# Patient Record
Sex: Female | Born: 2000 | Hispanic: No | Marital: Single | Smoking: Never smoker
Health system: Southern US, Community
[De-identification: ages and names within clinical notes are randomized; demographics above are authoritative.]

## PROBLEM LIST (undated history)

## (undated) DIAGNOSIS — H539 Unspecified visual disturbance: Secondary | ICD-10-CM

## (undated) DIAGNOSIS — L709 Acne, unspecified: Secondary | ICD-10-CM

## (undated) HISTORY — DX: Acne, unspecified: L70.9

## (undated) HISTORY — DX: Unspecified visual disturbance: H53.9

---
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
## 2019-02-02 ENCOUNTER — Ambulatory Visit (INDEPENDENT_AMBULATORY_CARE_PROVIDER_SITE_OTHER): Payer: No Typology Code available for payment source | Admitting: Licensed Clinical Social Worker

## 2019-02-02 ENCOUNTER — Other Ambulatory Visit: Payer: Self-pay

## 2019-02-02 ENCOUNTER — Ambulatory Visit (INDEPENDENT_AMBULATORY_CARE_PROVIDER_SITE_OTHER): Payer: No Typology Code available for payment source

## 2019-02-02 VITALS — BP 104/64 | HR 64 | Ht 63.0 in | Wt 140.4 lb

## 2019-02-02 DIAGNOSIS — F411 Generalized anxiety disorder: Secondary | ICD-10-CM

## 2019-02-02 DIAGNOSIS — Z3042 Encounter for surveillance of injectable contraceptive: Secondary | ICD-10-CM | POA: Diagnosis not present

## 2019-02-02 DIAGNOSIS — Z3202 Encounter for pregnancy test, result negative: Secondary | ICD-10-CM | POA: Diagnosis not present

## 2019-02-02 DIAGNOSIS — Z1389 Encounter for screening for other disorder: Secondary | ICD-10-CM

## 2019-02-02 LAB — POCT URINALYSIS DIPSTICK
Bilirubin, UA: NEGATIVE
Blood, UA: NEGATIVE
Glucose, UA: NEGATIVE
Ketones, UA: NEGATIVE
Leukocytes, UA: NEGATIVE
Nitrite, UA: NEGATIVE
Protein, UA: NEGATIVE
Spec Grav, UA: 1.01 (ref 1.010–1.025)
Urobilinogen, UA: NEGATIVE U/dL — AB
pH, UA: 7 (ref 5.0–8.0)

## 2019-02-02 LAB — POCT URINE PREGNANCY: Preg Test, Ur: NEGATIVE

## 2019-02-02 MED ORDER — MEDROXYPROGESTERONE ACETATE 150 MG/ML IM SUSP
150.0000 mg | Freq: Once | INTRAMUSCULAR | Status: AC
  Administered 2019-02-02: 150 mg via INTRAMUSCULAR

## 2019-02-02 NOTE — Patient Instructions (Signed)
Contraceptive Injection  A contraceptive injection is a shot that prevents pregnancy. It is also called the birth control shot. The shot contains the hormone progestin, which prevents pregnancy by:  · Stopping the ovaries from releasing eggs.  · Thickening cervical mucus to prevent sperm from entering the cervix.  · Thinning the lining of the uterus to prevent a fertilized egg from attaching to the uterus.  Contraceptive injections are given under the skin (subcutaneous) or into a muscle (intramuscular). For these shots to work, you must get one of them every 3 months (12 weeks) from a health care provider.  Tell a health care provider about:  · Any allergies you have.  · All medicines you are taking, including vitamins, herbs, eye drops, creams, and over-the-counter medicines.  · Any blood disorders you have.  · Any medical conditions you have.  · Whether you are pregnant or may be pregnant.  What are the risks?  Generally, this is a safe procedure. However, problems may occur, including:  · Mood changes or depression.  · Loss of bone density (osteoporosis) after long-term use.  · Blood clots.  · Higher risk of an egg being fertilized outside your uterus (ectopic pregnancy).This is rare.  What happens before the procedure?  · Your health care provider may do a routine physical exam.  · You may have a test to make sure you are not pregnant.  What happens during the procedure?  · The area where the shot will be given will be cleaned and sanitized with alcohol.  · A needle will be inserted into a muscle in your upper arm or buttock, or into the skin of your thigh or abdomen. The needle will be attached to a syringe with the medicine inside of it.  · The medicine will be pushed through the syringe and injected into your body.  · A small bandage (dressing) may be placed over the injection site.  What can I expect after the procedure?  · After the procedure, it is common to have:  ? Soreness around the injection site  for a couple of days.  ? Irregular menstrual bleeding.  ? Weight gain.  ? Breast tenderness.  ? Headaches.  ? Discomfort in your abdomen.  · Ask your health care provider whether you need to use an added method of birth control (backup contraception), such as a condom, sponge, or spermicide.  ? If the first shot is given 1-7 days after the start of your last period, you will not need backup contraception.  ? If the first shot is given at any other time during your menstrual cycle, you should avoid having sex or you will need backup contraception for 7 days after you receive the shot.  Follow these instructions at home:  General instructions    · Take over-the-counter and prescription medicines only as told by your health care provider.  · Do not massage the injection site.  · Track your menstrual periods so you will know if they become irregular.  · Always use a condom to protect against STIs (sexually transmitted infections).  · Make sure you schedule an appointment in time for your next shot, and mark it on your calendar. For the birth control to prevent pregnancy, you must get the injections every 3 months (12 weeks).  Lifestyle  · Do not use any products that contain nicotine or tobacco, such as cigarettes and e-cigarettes. If you need help quitting, ask your health care provider.  · Eat foods   that are high in calcium and vitamin D, such as milk, cheese, and salmon. Doing this may help with any loss in bone density that is caused by the contraceptive injection. Ask your health care provider for dietary recommendations.  Contact a health care provider if:  · You have nausea or vomiting.  · You have abnormal vaginal discharge or bleeding.  · You miss a period or you think you might be pregnant.  · You experience mood changes or depression.  · You feel dizzy or light-headed.  · You have leg pain.  Get help right away if:  · You have chest pain.  · You cough up blood.  · You have shortness of breath.  · You have a  severe headache that does not go away.  · You have numbness in any part of your body.  · You have slurred speech.  · You have vision problems.  · You have vaginal bleeding that is abnormally heavy or does not stop.  · You have severe pain in your abdomen.  · You have depression that does not get better with treatment.  If you ever feel like you may hurt yourself or others, or have thoughts about taking your own life, get help right away. You can go to your nearest emergency department or call:  · Your local emergency services (911 in the [place]  · A suicide crisis helpline, such as the National Suicide Prevention Lifeline at 1-800-273-8255. This is open 24 hours a day.  Summary  · A contraceptive injection is a shot that prevents pregnancy. It is also called the birth control shot.  · The shot is given under the skin (subcutaneous) or into a muscle (intramuscular).  · After this procedure, it is common to have soreness around the injection site for a couple of days.  · To prevent pregnancy, the shot must be given by a health care provider every 3 months (12 weeks).  · After you have the shot, ask your health care provider whether you need to use an added method of birth control (backup contraception), such as a condom, sponge, or spermicide.  This information is not intended to replace advice given to you by your health care provider. Make sure you discuss any questions you have with your health care provider.  Document Released: 07/04/2017 Document Revised: 07/04/2017 Document Reviewed: 07/04/2017  Elsevier Interactive Patient Education © 2019 [employer]vier Inc[employer]

## 2019-02-02 NOTE — BH Specialist Note (Signed)
Integrated Behavioral Health Initial Visit  MRN: [id number] Name: Jean Thompson  Number of Integrated Behavioral Health Clinician visits:: 1/6 Session Start time: 11:30  Session End time: 11:50 Total time: 20 minutes  Type of Service: Integrated Behavioral Health- Individual/Family Interpretor:No. Interpretor Name and Language: n/a   Warm Hand Off Completed.       SUBJECTIVE: Jean Thompson is a 18 y.o. female accompanied by self Patient was referred by Adolescent medicine for Med check in. Patient reports the following symptoms/concerns: Pt reports ongoing symptoms of anxiety, feels like they are well under control with med mgmt and coping skills. Pt reports anxiety around classes being cancelled and shift to online classes. Pt reports knowledge of relaxation techniques and supportive environment. Pt is also interested in birth control, specifically the Nexplanon implant. Pt reports using condoms currently. Duration of problem: ongoing anxiety concerns; Severity of problem: mild  OBJECTIVE: Mood: Anxious and Euthymic and Affect: Appropriate Risk of harm to self or others: No plan to harm self or others  LIFE CONTEXT: Family and Social: Lives on campus for now, will return to parents house when campus closes. Pt reports having supportive relationships in her life. 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  GOALS ADDRESSED: Patient will: 1. Demonstrate ability to: Maintain medication compliance  INTERVENTIONS: Interventions utilized: Supportive Counseling and Psychoeducation and/or Health Education  Standardized Assessments completed: Med side effect  check list and PHQ-SADS   PHQ-SADS SCORES 02/02/2019  PHQ-15 Score 1  Total GAD-7 Score 8  a. In the last 4 weeks, have you had an anxiety attack-suddenly feeling fear or panic? No  PHQ Adolescent Score 3  If you checked off any problems on this questionnaire, how difficult have these problems made it for you to do your work, take care of things at home, or get along with other people? Not difficult at all      Symptom Score (0-3) Linked to Medication? Comments  Dry Mouth 0    Drowsiness 0    Insomnia 0    Blurred Vision 0    Headache 3 Yes After forgetting meds and returning to taking meds  Constipation 0    Diarrhea  0    Increased Appetite 0    Decreased Appetite 0    Nausea/Vomiting 0    Problems Urinating 0    Problems with Sex 0    Palpitations 0    Lightheaded on Standing 0    Room Spinning 0    Sweating 0    Feeling Hot 0    Tremor 0    Disoriented 0    Yawning 0    Weight Gain 0    Other Symptoms? None  Treatment for Side Effects? Will lay down to rest when head hurts, may take ibuprofen  Side Effects make you want to stop taking?? Sometimes headaches after missed doses     ASSESSMENT: Patient currently experiencing ongoing and managed symptoms of anxiety. Pt also experiencing interest in different methods of birth control.   Patient may benefit from continuing to take anti-anxiety meds as prescribed, as well as continuing to implement effective coping strategies. Pt may also benefit from returning to see med provider for nexplanon implant.  PLAN: 1. Follow up with behavioral health clinician on : PRN; follow up w/ medical provider 03/16/2019 2. Behavioral recommendations: Pt will take meds as prescribed and reach out for support if needed 3. Referral(s): None at this time 4. "From scale of 1-10, how likely are you to follow plan?": Pt voiced understanding and agreement  Noralyn Pick, LPCA

## 2019-02-02 NOTE — Progress Notes (Signed)
negPt here today for vitals check. Collaborated with NP- plan of care made. Follow up scheduled for 4/24. She does express interest in Nexplanon insertion today. No provider here to perform insertion. Pt interested in getting depo for contraception to bridge her until f/u appointment with [facility] for insertion. Currently consistently using condoms and urine pregnancy negative. Depo given and patient education provided.

## 2019-02-11 ENCOUNTER — Other Ambulatory Visit: Payer: Self-pay | Admitting: Pediatrics

## 2019-02-11 DIAGNOSIS — Z889 Allergy status to unspecified drugs, medicaments and biological substances status: Secondary | ICD-10-CM

## 2019-02-21 DIAGNOSIS — H52223 Regular astigmatism, bilateral: Secondary | ICD-10-CM | POA: Diagnosis not present

## 2019-02-21 DIAGNOSIS — H5213 Myopia, bilateral: Secondary | ICD-10-CM | POA: Diagnosis not present

## 2019-02-21 DIAGNOSIS — H538 Other visual disturbances: Secondary | ICD-10-CM | POA: Diagnosis not present

## 2019-02-28 ENCOUNTER — Telehealth: Payer: Self-pay | Admitting: Licensed Clinical Social Worker

## 2019-02-28 NOTE — Telephone Encounter (Signed)
[facility] called pt to check in and offer support. Pt requested to schedule virtual visit. [facility] and pt to have virtual session 03/01/2019

## 2019-03-01 ENCOUNTER — Ambulatory Visit: Payer: No Typology Code available for payment source | Admitting: Licensed Clinical Social Worker

## 2019-03-01 ENCOUNTER — Telehealth: Payer: Self-pay | Admitting: Licensed Clinical Social Worker

## 2019-03-01 NOTE — Telephone Encounter (Signed)
[facility] called pt's mother at time of appt to follow up w/ pt. Mom reports not being at home w/ pt at the moment, will let pt know [facility] reached out. [facility] to call pt 03/02/19 to reschedule if pt interested.

## 2019-03-08 ENCOUNTER — Ambulatory Visit (INDEPENDENT_AMBULATORY_CARE_PROVIDER_SITE_OTHER): Payer: No Typology Code available for payment source | Admitting: Licensed Clinical Social Worker

## 2019-03-08 ENCOUNTER — Other Ambulatory Visit: Payer: Self-pay

## 2019-03-08 DIAGNOSIS — F411 Generalized anxiety disorder: Secondary | ICD-10-CM | POA: Diagnosis not present

## 2019-03-08 NOTE — BH Specialist Note (Signed)
Integrated Behavioral Health via Telemedicine Video Visit  03/08/2019 Jean Thompson [id number]  Session Start time: 1:45  Session End time: 2:18 Total time: 33 mins  Referring Provider: Adolescent medicine Type of Visit: Video Patient/Family location: Home [facility] Provider location: [facility] clinic All persons participating in visit: Pt and [facility]  Confirmed patient's address: Yes  Confirmed patient's phone number: Yes  Any changes to demographics: No   Confirmed patient's insurance: Yes  Any changes to patient's insurance: No   Discussed confidentiality: Yes   I connected with Jean Thompson by a video enabled telemedicine application and verified that I am speaking with the correct person using two identifiers.     I discussed the limitations of evaluation and management by telemedicine and the availability of in person appointments.  I discussed that the purpose of this visit is to provide behavioral health care while limiting exposure to the novel coronavirus.   Discussed there is a possibility of technology failure and discussed alternative modes of communication if that failure occurs.  I discussed that engaging in this video visit, they consent to the provision of behavioral healthcare and the services will be billed under their insurance.  Patient and/or legal guardian expressed understanding and consented to video visit: Pt consented  PRESENTING CONCERNS: Patient and/or family reports the following symptoms/concerns: Pt reports trying to stay calm during quarantine period. Pt reports often feeling antsy because she is not able to stick to her typical routine. Pt reports several coping skills that help her to stay busy, including painting, doing her school work, spending time with her sister, and exercising. Pt reports feeling worried about eating when bored, tries not to let herself get bored. Pt identifies when she is hungry and eats at those times. Pt also expresses  interest in making her space feel more comfortable and soothing by painting the walls in her room. Duration of problem: ongoing anxiety; Severity of problem: moderate  STRENGTHS (Protective Factors/Coping Skills): Pt is able to express herself artistically Pt has supportive relationships within and outside of her family Pt is able to identify regular schedule that feels comfortable for pt  GOALS ADDRESSED: Patient will: 1.  Increase knowledge and/or ability of: coping skills  2.  Demonstrate ability to: Increase healthy adjustment to current life circumstances  INTERVENTIONS: Interventions utilized:  Solution-Focused Strategies, Behavioral Activation, Supportive Counseling and Psychoeducation and/or Health Education Standardized Assessments completed: Not Needed  ASSESSMENT: Patient currently experiencing ongoing symptoms of anxiety and restlessness, as evidenced by pt's report. Pt is able to implement effective coping skills to help reduce anxieties. Pt is having some difficulty navigating school work virtually, is able to reach out to professors.   Patient may benefit from continuing to implement effective coping strategies, including artistic and physical expression.  PLAN: 1. Follow up with behavioral health clinician on : 03/23/2019 2. Behavioral recommendations: PT will call professor for quicker response time. Pt will continue to spend time outside when feeling antsy. Pt will focus on self-talk 3. Referral(s): Integrated [facility] (In Clinic)  I discussed the assessment and treatment plan with the patient and/or parent/guardian. They were provided an opportunity to ask questions and all were answered. They agreed with the plan and demonstrated an understanding of the instructions.   They were advised to call back or seek an in-person evaluation if the symptoms worsen or if the condition fails to improve as anticipated.  Jean Thompson

## 2019-03-16 ENCOUNTER — Ambulatory Visit (INDEPENDENT_AMBULATORY_CARE_PROVIDER_SITE_OTHER): Payer: No Typology Code available for payment source | Admitting: Family

## 2019-03-16 ENCOUNTER — Other Ambulatory Visit: Payer: Self-pay

## 2019-03-16 DIAGNOSIS — Z3042 Encounter for surveillance of injectable contraceptive: Secondary | ICD-10-CM | POA: Diagnosis not present

## 2019-03-16 NOTE — Progress Notes (Signed)
Virtual Visit via Telephone Note  I connected with Jean Thompson   on 03/16/19 at 10:00 AM EDT by telephone and verified that I am speaking with the correct person using two identifiers. Location of patient/parent: home   I discussed the limitations, risks, security and privacy concerns of performing an evaluation and management service by telephone and the availability of in person appointments. I discussed that the purpose of this phone visit is to provide medical care while limiting exposure to the novel coronavirus.  I also discussed with the patient that there may be a patient responsible charge related to this service. The patient expressed understanding and agreed to proceed.  Reason for visit: wants to come in to talk about birth control options  History of Present Illness:  -interested in nexplanon  -she had depo injection on 3/13 and wants to switch methods -condom use now    Assessment and Plan:  -scheduled for in office follow up for nexplanon placement, still well within her depo window  Follow Up Instructions:    I discussed the assessment and treatment plan with the patient and/or parent/guardian. They were provided an opportunity to ask questions and all were answered. They agreed with the plan and demonstrated an understanding of the instructions.   They were advised to call back or seek an in-person evaluation in the emergency room if the symptoms worsen or if the condition fails to improve as anticipated.  I provided 5 minutes of non-face-to-face time during this encounter. I was located off-site during this encounter.  Georges Mouse, NP

## 2019-03-21 ENCOUNTER — Other Ambulatory Visit: Payer: Self-pay

## 2019-03-21 ENCOUNTER — Encounter: Payer: Self-pay | Admitting: Family

## 2019-03-21 ENCOUNTER — Ambulatory Visit (INDEPENDENT_AMBULATORY_CARE_PROVIDER_SITE_OTHER): Payer: No Typology Code available for payment source | Admitting: Family

## 2019-03-21 ENCOUNTER — Encounter: Payer: No Typology Code available for payment source | Admitting: Licensed Clinical Social Worker

## 2019-03-21 VITALS — BP 103/70 | HR 71 | Ht 62.48 in | Wt 135.6 lb

## 2019-03-21 DIAGNOSIS — R748 Abnormal levels of other serum enzymes: Secondary | ICD-10-CM | POA: Diagnosis not present

## 2019-03-21 DIAGNOSIS — Z3202 Encounter for pregnancy test, result negative: Secondary | ICD-10-CM

## 2019-03-21 DIAGNOSIS — F4322 Adjustment disorder with anxiety: Secondary | ICD-10-CM

## 2019-03-21 DIAGNOSIS — Z3009 Encounter for other general counseling and advice on contraception: Secondary | ICD-10-CM | POA: Diagnosis not present

## 2019-03-21 DIAGNOSIS — F509 Eating disorder, unspecified: Secondary | ICD-10-CM

## 2019-03-21 LAB — POCT URINE PREGNANCY: Preg Test, Ur: NEGATIVE

## 2019-03-21 NOTE — Patient Instructions (Signed)
Check these websites out for more information!     [email or web address]   [email or web address]  Also, set up a My Chart account so we can discuss! Let me know when you decide what you want to do!

## 2019-03-22 ENCOUNTER — Encounter: Payer: Self-pay | Admitting: Family

## 2019-03-22 NOTE — Progress Notes (Addendum)
History was provided by the patient.  Jean Thompson is a 18 y.o. female who is here for contraceptive counseling.   PCP confirmed? Yes.    Kalman Jewels, MD  HPI:  -using condoms, wants information only today -had depo on 3/13 when she was considering nexplanon at that time  -then wants to talk with mom and BF more -she does not care if she bleeds or if her period stops w the method -she has no pregnancy intent and likes the idea of a long-term method -she admits she forgets to take medicines -not sure if she would want depo bc of her eating disorder hx, does not want the method she chooses to cause weight gain   Review of Systems  Constitutional: Negative for fever and weight loss.  HENT: Negative for sore throat.   Respiratory: Negative for cough and shortness of breath.   Cardiovascular: Negative for chest pain.  Gastrointestinal: Negative for abdominal pain, nausea and vomiting.  Genitourinary: Negative for dysuria and frequency.  Musculoskeletal: Negative for myalgias.  Skin: Negative for rash.  Neurological: Negative for dizziness and headaches (no migraines w aura).     Patient Active Problem List   Diagnosis Date Noted  . Acute midline thoracic back pain 05/26/2018  . Adjustment disorder with anxious mood 08/17/2017  . Elevated liver enzymes 05/10/2017  . Disordered eating 04/06/2017  . Bunion of left foot 03/04/2016  . Other seasonal allergic rhinitis 02/25/2016  . Acne     Current Outpatient Medications on File Prior to Visit  Medication Sig Dispense Refill  . cetirizine (ZYRTEC) 10 MG tablet Take 1 tablet by mouth once daily 30 tablet 0  . Ergocalciferol (VITAMIN D2) 400 units TABS Take 200 mg by mouth.    Marland Kitchen FLUoxetine (PROZAC) 20 MG capsule Take 1 capsule (20 mg total) by mouth daily. 30 capsule 3  . hydrocortisone 1 % ointment Apply 1 application topically 2 (two) times daily. Apply to itchy area after completing antibiotic treatment as needed 30  g 0  . diclofenac sodium (VOLTAREN) 1 % GEL Apply 2 g topically 4 (four) times daily. (Patient not taking: Reported on 03/21/2019) 100 g 3  . methylPREDNISolone (MEDROL) 4 MG tablet Medrol dose pack. Take as instructed (Patient not taking: Reported on 02/02/2019) 21 tablet 0  . mupirocin ointment (BACTROBAN) 2 % Apply 1 application topically 2 (two) times daily. Use for 5 days to the affected area (Patient not taking: Reported on 03/21/2019) 30 g 0   No current facility-administered medications on file prior to visit.     No Known Allergies  Physical Exam:    Vitals:   03/21/19 1056  BP: 103/70  Pulse: 71  Weight: 135 lb 9.6 oz (61.5 kg)  Height: 5' 2.48" (1.587 m)    Blood pressure percentiles are not available for patients who are 18 years or older. No LMP recorded.  Physical Exam HENT:     Mouth/Throat:     Mouth: Mucous membranes are moist.  Eyes:     Extraocular Movements: Extraocular movements intact.     Pupils: Pupils are equal, round, and reactive to light.  Cardiovascular:     Rate and Rhythm: Normal rate and regular rhythm.     Heart sounds: No murmur.  Pulmonary:     Effort: Pulmonary effort is normal.  Musculoskeletal: Normal range of motion.  Lymphadenopathy:     Cervical: No cervical adenopathy.  Skin:    General: Skin is warm and dry.  Findings: No rash.  Neurological:     General: No focal deficit present.     Mental Status: She is alert and oriented to person, place, and time.  Psychiatric:        Attention and Perception: Attention normal.        Mood and Affect: Mood is anxious.      Assessment/Plan: 18 yo female interested in contraception. We reviewed Tier 1 and Tier 2 options including IUD, implant, depo, pill, patch, ring and she is going to contemplate her options. Need to check her liver enzymes again and consider hep panel with Hep A IgM. Future orders in. Results may influence what method she chooses. Discussed weight gain associated with  Depo and will monitor cautiously depending what method.

## 2019-03-23 ENCOUNTER — Ambulatory Visit: Payer: No Typology Code available for payment source | Admitting: Licensed Clinical Social Worker

## 2019-04-04 ENCOUNTER — Ambulatory Visit: Payer: Medicaid Other | Admitting: Licensed Clinical Social Worker

## 2019-04-04 ENCOUNTER — Other Ambulatory Visit: Payer: Self-pay

## 2019-04-07 ENCOUNTER — Telehealth: Payer: Self-pay | Admitting: Licensed Clinical Social Worker

## 2019-04-07 NOTE — Telephone Encounter (Signed)
Called patient regarding pre-screening for 5/18 visit, but no answer and no option to leave a message, as VM not setup yet.

## 2019-04-09 ENCOUNTER — Ambulatory Visit: Payer: Medicaid Other

## 2019-04-20 ENCOUNTER — Ambulatory Visit: Payer: Self-pay | Admitting: Licensed Clinical Social Worker

## 2019-06-18 ENCOUNTER — Other Ambulatory Visit: Payer: Self-pay

## 2019-06-18 DIAGNOSIS — Z20822 Contact with and (suspected) exposure to covid-19: Secondary | ICD-10-CM

## 2019-06-18 DIAGNOSIS — R6889 Other general symptoms and signs: Secondary | ICD-10-CM | POA: Diagnosis not present

## 2019-06-20 LAB — NOVEL CORONAVIRUS, NAA: SARS-CoV-2, NAA: NOT DETECTED

## 2019-06-22 ENCOUNTER — Other Ambulatory Visit: Payer: Self-pay

## 2019-06-22 DIAGNOSIS — R6889 Other general symptoms and signs: Secondary | ICD-10-CM | POA: Diagnosis not present

## 2019-06-22 DIAGNOSIS — Z20822 Contact with and (suspected) exposure to covid-19: Secondary | ICD-10-CM

## 2019-06-24 LAB — NOVEL CORONAVIRUS, NAA: SARS-CoV-2, NAA: DETECTED — AB

## 2019-06-25 ENCOUNTER — Other Ambulatory Visit: Payer: Self-pay

## 2019-06-25 ENCOUNTER — Ambulatory Visit: Payer: Medicaid Other | Admitting: Pediatrics

## 2019-06-25 ENCOUNTER — Telehealth: Payer: Self-pay

## 2019-06-25 NOTE — Telephone Encounter (Signed)
Patient to be scheduled for a virtual visit today with Dr. Tami Ribas.

## 2019-06-25 NOTE — Telephone Encounter (Signed)
Per Dr. Tami Ribas, because symtoms are mild patient does not need appointment unless patient desires. Spoke with patient. Parents and sibling also have COVID 4. Patient has been quarantining since Friday. She has runny nose, headache, diarrhea, no sense of smell. Discussed length of quarantine and when criteria for release. She will call [facility] if symtoms do not improve.

## 2019-06-25 NOTE — Telephone Encounter (Signed)
Per Sarah at the patient engagement center patient is COVID 19 positive. Test was 06/22/2019. She had a previous test on 06/18/2019 that was negative.

## 2019-07-05 ENCOUNTER — Other Ambulatory Visit: Payer: Self-pay

## 2019-07-05 DIAGNOSIS — Z20822 Contact with and (suspected) exposure to covid-19: Secondary | ICD-10-CM

## 2019-07-07 LAB — NOVEL CORONAVIRUS, NAA: SARS-CoV-2, NAA: NOT DETECTED

## 2019-07-10 DIAGNOSIS — H5213 Myopia, bilateral: Secondary | ICD-10-CM | POA: Diagnosis not present

## 2019-08-02 DIAGNOSIS — H5213 Myopia, bilateral: Secondary | ICD-10-CM | POA: Diagnosis not present

## 2019-08-27 DIAGNOSIS — H5213 Myopia, bilateral: Secondary | ICD-10-CM | POA: Diagnosis not present

## 2019-08-27 DIAGNOSIS — H52223 Regular astigmatism, bilateral: Secondary | ICD-10-CM | POA: Diagnosis not present

## 2019-08-27 DIAGNOSIS — H538 Other visual disturbances: Secondary | ICD-10-CM | POA: Diagnosis not present

## 2019-09-11 DIAGNOSIS — H5213 Myopia, bilateral: Secondary | ICD-10-CM | POA: Diagnosis not present

## 2019-09-19 ENCOUNTER — Telehealth: Payer: Self-pay | Admitting: Pediatrics

## 2019-09-19 NOTE — Telephone Encounter (Signed)

## 2019-09-20 ENCOUNTER — Ambulatory Visit (INDEPENDENT_AMBULATORY_CARE_PROVIDER_SITE_OTHER): Payer: Medicaid Other | Admitting: Family

## 2019-09-20 ENCOUNTER — Other Ambulatory Visit (HOSPITAL_COMMUNITY)
Admission: RE | Admit: 2019-09-20 | Discharge: 2019-09-20 | Disposition: A | Discharge status: Home / Self Care | Payer: Medicaid Other | Source: Ambulatory Visit | Attending: Family | Admitting: Family

## 2019-09-20 ENCOUNTER — Encounter: Payer: Self-pay | Admitting: Family

## 2019-09-20 ENCOUNTER — Other Ambulatory Visit: Payer: Self-pay

## 2019-09-20 VITALS — BP 116/67 | HR 74 | Ht 62.8 in | Wt 133.2 lb

## 2019-09-20 DIAGNOSIS — Z3049 Encounter for surveillance of other contraceptives: Secondary | ICD-10-CM

## 2019-09-20 DIAGNOSIS — Z113 Encounter for screening for infections with a predominantly sexual mode of transmission: Secondary | ICD-10-CM | POA: Diagnosis not present

## 2019-09-20 DIAGNOSIS — N926 Irregular menstruation, unspecified: Secondary | ICD-10-CM | POA: Diagnosis not present

## 2019-09-20 DIAGNOSIS — Z3202 Encounter for pregnancy test, result negative: Secondary | ICD-10-CM | POA: Diagnosis not present

## 2019-09-20 DIAGNOSIS — Z30017 Encounter for initial prescription of implantable subdermal contraceptive: Secondary | ICD-10-CM

## 2019-09-20 LAB — POCT URINE PREGNANCY: Preg Test, Ur: NEGATIVE

## 2019-09-20 MED ORDER — ETONOGESTREL 68 MG ~~LOC~~ IMPL
68.0000 mg | DRUG_IMPLANT | Freq: Once | SUBCUTANEOUS | Status: AC
  Administered 2019-09-20: 10:00:00 68 mg via SUBCUTANEOUS

## 2019-09-20 NOTE — Progress Notes (Addendum)
THIS RECORD MAY CONTAIN CONFIDENTIAL INFORMATION THAT SHOULD NOT BE RELEASED WITHOUT REVIEW OF THE SERVICE PROVIDER.  Adolescent Medicine Consultation Follow-Up Visit Jean Thompson  is a 18 y.o. female referred by Rae Lips, MD here today for follow-up regarding Nexplanon placement.    Plan at last adolescent specialty clinic  visit included talk about Nexplanon placement on 4/29 (had received Depo Provera on 3/13). She had weight gain on Depo Provera and wanted an alternative BC option. She has a history of anxiety and disordered eating' last visit for the latter was about 1 yr ago, when she was doing very well.  Pertinent Labs? No Growth Chart Viewed? yes   History was provided by the patient and mother.  Interpreter? no  Chief complaint:   HPI:   PCP Confirmed?  yes  My Chart Activated?   yes   Patient would like Nexplanon today for birth control. She previously was on Depo Provera and had unwanted weight gain. She has been sexually active with condom use for contraception since then. She has a history of irregular periods with one occurring every 2 months, lasting for 7 days using 2 tampons or pads per day. Her LMP was 10 days ago and she has not has sex in the past week.   No history of bleeding or clotting. No significant headache history. No vaginal bleeding, discharge, or pain today. She is R handed and weight lifts three days a week (next scheduled day is tomorrow). She feels safe at home and in relationships.   Mom had a mirena and tolerated that well.   Review of Systems negative except where noted above  Patient's last menstrual period was 09/13/2019 (approximate). No Known Allergies Current Outpatient Medications on File Prior to Visit  Medication Sig Dispense Refill  . cetirizine (ZYRTEC) 10 MG tablet Take 1 tablet by mouth once daily 30 tablet 0  . Ergocalciferol (VITAMIN D2) 400 units TABS Take 200 mg by mouth.    Marland Kitchen FLUoxetine (PROZAC) 20 MG  capsule Take 1 capsule (20 mg total) by mouth daily. 30 capsule 3  . diclofenac sodium (VOLTAREN) 1 % GEL Apply 2 g topically 4 (four) times daily. (Patient not taking: Reported on 03/21/2019) 100 g 3  . hydrocortisone 1 % ointment Apply 1 application topically 2 (two) times daily. Apply to itchy area after completing antibiotic treatment as needed (Patient not taking: Reported on 09/20/2019) 30 g 0  . methylPREDNISolone (MEDROL) 4 MG tablet Medrol dose pack. Take as instructed (Patient not taking: Reported on 02/02/2019) 21 tablet 0  . mupirocin ointment (BACTROBAN) 2 % Apply 1 application topically 2 (two) times daily. Use for 5 days to the affected area (Patient not taking: Reported on 03/21/2019) 30 g 0   No current facility-administered medications on file prior to visit.     Patient Active Problem List   Diagnosis Date Noted  . Acute midline thoracic back pain 05/26/2018  . Adjustment disorder with anxious mood 08/17/2017  . Elevated liver enzymes 05/10/2017  . Disordered eating 04/06/2017  . Bunion of left foot 03/04/2016  . Other seasonal allergic rhinitis 02/25/2016  . Acne     Social History: Changes with school since last visit?  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  Changes at home or school since last visit:  no  Gender identity: F Sex assigned at birth: F Pronouns: she Tobacco?  no Drugs/ETOH?  no Partner preference?  female  Sexually Active?  yes  Pregnancy Prevention:  condoms Reviewed condoms:  yes Reviewed EC:  yes   Suicidal or homicidal thoughts?   no Self injurious behaviors?  no   The following portions of the patient's history were reviewed and updated as appropriate: allergies, current medications, past family history, past medical history, past social history,  past surgical history and problem list.  Physical Exam:  Vitals:   09/20/19 0917  BP: 116/67  Pulse: 74  Weight: 133 lb 3.2 oz (60.4 kg)  Height: 5' 2.8" (1.595 m)   BP 116/67   Pulse 74   Ht 5' 2.8" (1.595 m)   Wt 133 lb 3.2 oz (60.4 kg)   LMP 09/13/2019 (Approximate)   BMI 23.75 kg/m  Body mass index: body mass index is 23.75 kg/m. Blood pressure percentiles are not available for patients who are 18 years or older.   Physical Exam Vitals signs and nursing note reviewed.  Constitutional:      General: She is not in acute distress.    Appearance: Normal appearance. She is not ill-appearing.  HENT:     Head: Normocephalic.     Nose: Nose normal. No congestion or rhinorrhea.     Mouth/Throat:     Mouth: Mucous membranes are moist.  Eyes:     General:        Right eye: No discharge.        Left eye: No discharge.     Conjunctiva/sclera: Conjunctivae normal.     Pupils: Pupils are equal, round, and reactive to light.  Neck:     Musculoskeletal: Normal range of motion.  Cardiovascular:     Rate and Rhythm: Normal rate.     Pulses: Normal pulses.  Pulmonary:     Effort: Pulmonary effort is normal. No respiratory distress.  Musculoskeletal:     Left elbow: Normal. She exhibits normal range of motion, no swelling, no effusion, no deformity and no laceration.  Skin:    General: Skin is warm.     Capillary Refill: Capillary refill takes less than 2 seconds.     Findings: No bruising, lesion or rash.  Neurological:     General: No focal deficit present.     Mental Status: She is alert and oriented to person, place, and time.  Psychiatric:        Mood and Affect: Mood normal.        Behavior: Behavior normal.        Thought Content: Thought content normal.    UPT was negative today  Assessment/Plan: Jean Thompson is a 18 y.o. female with a history of irregular periods who presents today for Nexplanon placement. She has a history of elevated LFTs in the  setting of disordered eating -- will consider getting these labs collected at her follow up visit.   1. Encounter for initial prescription of Nexplanon - Nexplanon was placed 8cm proximal to the L medial malleolus without complication. Patient tolerated the procedure well. See procedure note below.  - RTC 1 month for further evaluation - counseled on supportive care today. Also counseled extensively on potential side effects of the medication - counseled condom use at all times, particularly over next 7 days.  - return precautions reviewed. - etonogestrel (NEXPLANON) implant 68 mg - Subdermal Etonogestrel Implant Insertion  2. Pregnancy examination or test, negative result - negative today -  POCT urine pregnancy  3. Routine screening for STI (sexually transmitted infection) - GC/Chlamydia sent today - Urine cytology ancillary only  Follow-up:  Return for please schedule virtual nexplanon f/u in 1 mo with Christy.   Medical decision-making:  >20 minutes spent face to face with patient with more than 50% of appointment spent discussing diagnosis, management, follow-up, and reviewing of prior records.  Cori Razor, MD Pediatrics, PGY-3   __________________________  Nunzio Cobbs Insertion  No contraindications for placement.  No liver disease, no unexplained vaginal bleeding, no h/o breast cancer, no h/o blood clots.  Patient's last menstrual period was 09/13/2019 (approximate).  UHCG: negative  Last Unprotected sex:  14 days ago  Risks & benefits of Nexplanon discussed The nexplanon device was purchased and supplied by [facility]. Packaging instructions supplied to patient Consent form signed  The patient denies any allergies to anesthetics or antiseptics.  Procedure: Pt was placed in supine position. The left arm was flexed at the elbow and externally rotated so that her wrist was parallel to her ear The medial epicondyle of the left arm was identified The insertions  site was marked 8 cm proximal to the medial epicondyle The insertion site was cleaned with Betadine The area surrounding the insertion site was covered with a sterile drape 1% lidocaine was injected just under the skin at the insertion site extending 4 cm proximally. The sterile preloaded disposable Nexaplanon applicator was removed from the sterile packaging The applicator needle was inserted at a 30 degree angle at 8 cm proximal to the medial epicondyle as marked The applicator was lowered to a horizontal position and advanced just under the skin for the full length of the needle The slider on the applicator was retracted fully while the applicator remained in the same position, then the applicator was removed. The implant was confirmed via palpation as being in position The implant position was demonstrated to the patient Pressure dressing was applied to the patient.  The patient was instructed to removed the pressure dressing in 24 hrs.  The patient was advised to move slowly from a supine to an upright position  The patient denied any concerns or complaints  The patient was instructed to schedule a follow-up appt in 1 month and to call sooner if any concerns.  The patient acknowledged agreement and understanding of the plan.

## 2019-09-20 NOTE — Patient Instructions (Addendum)
Follow-up  in 1 month. Schedule this appointment before you leave clinic today.  Congratulations on getting your Nexplanon placement!  Below is some important information about Nexplanon.  First remember that Nexplanon does not prevent sexually transmitted infections.  Condoms will help prevent sexually transmitted infections. The Nexplanon starts working 7 days after it was inserted.  There is a risk of getting pregnant if you have unprotected sex in those first 7 days after placement of the Nexplanon.  The Nexplanon lasts for 3 years but can be removed at any time.  You can become pregnant as early as 1 week after removal.  You can have a new Nexplanon put in after the old one is removed if you like.  It is not known whether Nexplanon is as effective in women who are very overweight because the studies did not include many overweight women.  Nexplanon interacts with some medications, including barbiturates, bosentan, carbamazepine, felbamate, griseofulvin, oxcarbazepine, phenytoin, rifampin, St. John's wort, topiramate, HIV medicines.  Please alert your doctor if you are on any of these medicines.  Always tell other healthcare providers that you have a Nexplanon in your arm.  The Nexplanon was placed just under the skin.  Leave the outside bandage on for 24 hours.  Leave the smaller bandage on for 3-5 days or until it falls off on its own.  Keep the area clean and dry for 3-5 days. There is usually bruising or swelling at the insertion site for a few days to a week after placement.  If you see redness or pus draining from the insertion site, call us immediately.  Keep your user card with the date the implant was placed and the date the implant is to be removed.  The most common side effect is a change in your menstrual bleeding pattern.   This bleeding is generally not harmful to you but can be annoying.  Call or come in to see us if you have any concerns about the bleeding or if you have any  side effects or questions.    We will call you in 1 week to check in and we would like you to return to the clinic for a follow-up visit in 1 month.  You can call [facility] 24 hours a day with any questions or concerns.  There is always a nurse or doctor available to take your call.  Call 9-1-1 if you have a life-threatening emergency.  For anything else, please call us at [phone] before heading to the ER. 

## 2019-09-20 NOTE — Progress Notes (Signed)
Supervising Provider Co-Signature  I reviewed with the resident the medical history and the resident's findings on physical examination.  I discussed with the resident the patient's diagnosis and concur with the treatment plan as documented in the resident's note.  I supervised this procedure and was immediately available to furnish services during the procedure.  The key elements of the procedure are outlined in the resident's note.  Parthenia Ames, NP

## 2019-09-21 LAB — URINE CYTOLOGY ANCILLARY ONLY
Chlamydia: NEGATIVE
Comment: NEGATIVE
Comment: NEGATIVE
Comment: NORMAL
Neisseria Gonorrhea: NEGATIVE
Trichomonas: NEGATIVE

## 2019-10-01 ENCOUNTER — Telehealth: Payer: Self-pay

## 2019-10-01 NOTE — Telephone Encounter (Signed)
Pre-screening for onsite visit  1. Who is bringing the patient to the visit? Self  Informed only one adult can bring patient to the visit to limit possible exposure to COVID19 and facemasks must be worn while in the building by the patient (ages 2 and older) and adult.  2. Has the person bringing the patient or the patient been around anyone with suspected or confirmed COVID-19 in the last 14 days? no   3. Has the person bringing the patient or the patient been around anyone who has been tested for COVID-19 in the last 14 days? no  4. Has the person bringing the patient or the patient had any of these symptoms in the last 14 days? no   Fever (temp 100 F or higher) Breathing problems Cough Sore throat Body aches Chills Vomiting Diarrhea   If all answers are negative, advise patient to call our office prior to your appointment if you or the patient develop any of the symptoms listed above.   If any answers are yes, cancel in-office visit and schedule the patient for a same day telehealth visit with a provider to discuss the next steps. 

## 2019-10-02 ENCOUNTER — Other Ambulatory Visit: Payer: Self-pay

## 2019-10-02 ENCOUNTER — Encounter: Payer: Self-pay | Admitting: Pediatrics

## 2019-10-02 ENCOUNTER — Other Ambulatory Visit (HOSPITAL_COMMUNITY)
Admission: RE | Admit: 2019-10-02 | Discharge: 2019-10-02 | Disposition: A | Discharge status: Home / Self Care | Payer: Medicaid Other | Source: Ambulatory Visit | Attending: Pediatrics | Admitting: Pediatrics

## 2019-10-02 ENCOUNTER — Ambulatory Visit (INDEPENDENT_AMBULATORY_CARE_PROVIDER_SITE_OTHER): Payer: Medicaid Other | Admitting: Pediatrics

## 2019-10-02 VITALS — BP 120/60 | HR 80 | Ht 61.81 in | Wt 131.0 lb

## 2019-10-02 DIAGNOSIS — Z23 Encounter for immunization: Secondary | ICD-10-CM | POA: Diagnosis not present

## 2019-10-02 DIAGNOSIS — Z0001 Encounter for general adult medical examination with abnormal findings: Secondary | ICD-10-CM

## 2019-10-02 DIAGNOSIS — Z68.41 Body mass index (BMI) pediatric, 5th percentile to less than 85th percentile for age: Secondary | ICD-10-CM | POA: Diagnosis not present

## 2019-10-02 DIAGNOSIS — N949 Unspecified condition associated with female genital organs and menstrual cycle: Secondary | ICD-10-CM

## 2019-10-02 DIAGNOSIS — Z113 Encounter for screening for infections with a predominantly sexual mode of transmission: Secondary | ICD-10-CM

## 2019-10-02 LAB — POCT RAPID HIV: Rapid HIV, POC: NEGATIVE

## 2019-10-02 NOTE — Patient Instructions (Addendum)
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 I have listed below some adult care clinics  Don't forget to get your flu shot at student health, the health department, or a local pharmacy. We will call with your test results in the next few days.    Adult Primary Care Clinics Name [facility]  Address: [place], [place] [address]  Phone: [phone] Hours: Monday - Friday 9 AM -6 PM  Types of insurance accepted:  Marland Kitchen Pharmacist, community . [place] (orange card) . Medicaid . Medicare . Uninsured  Language services:  Marland Kitchen Video and phone interpreters available   Ages 80 and older    . Adult primary care . Onsite pharmacy . Integrated behavioral health . Financial assistance counseling . Walk-in hours for established patients  Financial assistance counseling hours: Tuesdays 2:00PM - 5:00PM  Thursday 8:30AM - 4:30PM  Space is limited, 10 on Tuesday and 20 on Thursday. It's on first come first serve basis  Name [facility]  Address: [address] [place], [place] [address]  Phone: [phone]  Hours: Monday - Friday 8:30 AM - 5 PM  Types of insurance accepted:  Marland Kitchen Pharmacist, community . Medicaid . Medicare . Uninsured  Language services:  Marland Kitchen Video and phone interpreters available   All ages - newborn to adult   . Primary care for all ages (children and adults) . Integrated behavioral health . Nutritionist . Financial assistance counseling   Name [facility] on the ground floor of [facility]  Address: [address],  [place]  [address]  Phone: [phone]  Hours: Monday - Friday 8:15 AM - 5 PM  Types of insurance accepted:  Marland Kitchen Pharmacist, community . Medicaid . Medicare . Uninsured  Language services:  Marland Kitchen Video and  phone interpreters available   Ages 59 and older   . Adult primary care . Nutritionist . Certified Diabetes Educator  . Integrated behavioral health . Financial assistance counseling   Name [facility]  Address: [address] [place],  [address]  Phone: [phone]  Hours: Monday - Friday 8:30 AM - 5 PM    Types of insurance accepted:  Marland Kitchen Pharmacist, community . Medicaid . Medicare . Uninsured  Language services:  Marland Kitchen Video and phone interpreters available   All ages - newborn to adult   . Primary care for all ages (children and adults) . Integrated behavioral health . Financial assistance counseling

## 2019-10-02 NOTE — Progress Notes (Signed)
Adolescent Well Care Visit Jean Thompson is a 18 y.o. female who is here for well care.    PCP:  Rae Lips, MD   History was provided by the patient.  Confidentiality was discussed with the patient and, if applicable, with caregiver as well. Patient's personal or confidential phone number: [phone]   Current Issues: Current concerns include Concern about burning with sexual intercourse. She has no burning with urination. No vaginal discharge. Her partner used latex condom and this has burned before.   Prior Concerns:  prior history disordered eating - healthy diet, exercise, and body image at this time.  nexplanon placed 09/20/2019 Needs flu vaccine-cannot give here since aged out of State Vaccine.   Nutrition: Nutrition/Eating Behaviors: good variety of foods and no meal skipping Adequate calcium in diet?: yes Supplements/ Vitamins: yes  Exercise/ Media: Play any Sports?/ Exercise: daily Screen Time:  > 2 hours-counseling provided Media Rules or Monitoring?: no  Sleep:  Sleep: 10 hours  Social Screening: Lives with:  Dorms at [employer] Parental relations:  good Activities, Work, and Research officer, political party?: Works at [employer] 3 days per week.  Concerns regarding behavior with peers?  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  School Behavior: doing well; no concerns  Menstruation:   Patient's last menstrual period was 09/13/2019 (approximate). Menstrual History: recent nexplanon placement-adolescent clinic visit reviewed.    Confidential Social History: Tobacco?  no Secondhand smoke exposure?  no Drugs/ETOH?  no  Sexually Active?  yes   Pregnancy Prevention: nexplanon and condoms  Safe at home, in school & in relationships?  Yes Safe to self?  Yes   Screenings: Patient has a dental home: yes  The  patient completed the Rapid Assessment for Adolescent Preventive Services screening questionnaire and the following topics were identified as risk factors and discussed: healthy eating, exercise, tobacco use, marijuana use, drug use, condom use, birth control, sexuality and mental health issues    PHQ-9 completed and results indicated no concerns at this time  Physical Exam:  Vitals:   10/02/19 0944  BP: 120/60  Pulse: 80  Weight: 131 lb (59.4 kg)  Height: 5' 1.81" (1.57 m)   BP 120/60 (BP Location: Right Arm, Patient Position: Sitting, Cuff Size: Normal)   Pulse 80   Ht 5' 1.81" (1.57 m)   Wt 131 lb (59.4 kg)   LMP 09/13/2019 (Approximate)   BMI 24.11 kg/m  Body mass index: body mass index is 24.11 kg/m. Blood pressure percentiles are not available for patients who are 18 years or older.   Hearing Screening   Method: Audiometry   125Hz  250Hz  500Hz  1000Hz  2000Hz  3000Hz  4000Hz  6000Hz  8000Hz   Right ear:   20 20 20  20     Left ear:   20 20 20  20       Visual Acuity Screening   Right eye Left eye Both eyes  Without correction:     With correction: 20/20 20/20     General Appearance:   alert, oriented, no acute distress and well nourished  HENT: Normocephalic, no obvious abnormality, conjunctiva clear  Mouth:   Normal appearing teeth, no obvious discoloration, dental caries, or dental caps  Neck:   Supple; thyroid: no enlargement, symmetric, no tenderness/mass/nodules  Chest Tanner 5 normal breast exam-discussed self breast exam  Lungs:   Clear to auscultation bilaterally, normal work  of breathing  Heart:   Regular rate and rhythm, S1 and S2 normal, no murmurs;   Abdomen:   Soft, non-tender, no mass, or organomegaly  GU normal female external genitalia, pelvic not performed  Musculoskeletal:   Tone and strength strong and symmetrical, all extremities               Lymphatic:   No cervical adenopathy  Skin/Hair/Nails:   Skin warm, dry and intact, no rashes, no bruises or  petechiae  Neurologic:   Strength, gait, and coordination normal and age-appropriate     Assessment and Plan:   1. Encounter for general adult medical examination with abnormal findings Healthy body weight, lifestyle and body image. Past history disordered eating Vaginitis by history   BMI is appropriate for age  Hearing screening result:normal Vision screening result: normal      2. Body mass index, pediatric, 5th percentile to less than 85th percentile for age Reviewed healthy lifestyle, including sleep, diet, activity, and screen time for age.   3. Vaginal burning Suspect reaction to lasix but will R/O infectious etiology and call patient with result.   - CFCVaginitis molecular probe - GC/Chlamydia [facility] - for urine and other sample types - POC Rapid HIV  4. Need for vaccination Patient to go to student health or health department for flu vaccine Discussed transition to adult care and resources provided.    Return for Patient to transition to adult care clinic.Marland Kitchen  Kalman Jewels, MD

## 2019-10-03 LAB — WET PREP BY MOLECULAR PROBE
Candida species: NOT DETECTED
Gardnerella vaginalis: NOT DETECTED
MICRO NUMBER:: 1084158
SPECIMEN QUALITY:: ADEQUATE
Trichomonas vaginosis: NOT DETECTED

## 2019-10-03 LAB — URINE CYTOLOGY ANCILLARY ONLY
Chlamydia: NEGATIVE
Comment: NEGATIVE
Comment: NORMAL
Neisseria Gonorrhea: NEGATIVE

## 2019-10-10 ENCOUNTER — Ambulatory Visit: Payer: Medicaid Other | Admitting: Family

## 2019-12-10 DIAGNOSIS — Z1152 Encounter for screening for COVID-19: Secondary | ICD-10-CM | POA: Diagnosis not present

## 2020-04-08 DIAGNOSIS — H52223 Regular astigmatism, bilateral: Secondary | ICD-10-CM | POA: Diagnosis not present

## 2020-04-08 DIAGNOSIS — H538 Other visual disturbances: Secondary | ICD-10-CM | POA: Diagnosis not present

## 2020-04-08 DIAGNOSIS — H5213 Myopia, bilateral: Secondary | ICD-10-CM | POA: Diagnosis not present

## 2020-05-31 ENCOUNTER — Ambulatory Visit (HOSPITAL_COMMUNITY)
Admission: EM | Admit: 2020-05-31 | Discharge: 2020-05-31 | Disposition: A | Discharge status: Home / Self Care | Payer: Medicaid Other | Attending: Emergency Medicine | Admitting: Emergency Medicine

## 2020-05-31 ENCOUNTER — Other Ambulatory Visit: Payer: Self-pay

## 2020-05-31 ENCOUNTER — Encounter (HOSPITAL_COMMUNITY): Payer: Self-pay

## 2020-05-31 DIAGNOSIS — M546 Pain in thoracic spine: Secondary | ICD-10-CM

## 2020-05-31 MED ORDER — METHYLPREDNISOLONE 4 MG PO TBPK: ORAL_TABLET | ORAL | 0 refills | Status: DC

## 2020-05-31 NOTE — ED Triage Notes (Signed)
Pt present back pain that has been troubling her for four years now.  The problem was recently treated but now has returned and gotten worst.

## 2020-05-31 NOTE — Discharge Instructions (Signed)
Medrol dose pack prescribed- take with food Avoid heavy lifting, avoid complete bed rest Follow up if not improving or worsening

## 2020-06-01 NOTE — ED Provider Notes (Signed)
[facility]    CSN: [id number] Arrival date & time: 05/31/20  1438      History   Chief Complaint Chief Complaint  Patient presents with  . Back Pain    HPI Jean Thompson is a 19 y.o. female presenting today for evaluation of back pain.  Patient reports that over the past month she has had worsening back pain in her mid lower back.  Feels similar to prior pain she felt when she had back pain a few years ago.  She saw orthopedics as well as went to physical therapy.  She has been using ibuprofen 400 mg.  Reports remote history of injury from lifting weights as well as being stepped on while playing soccer.  Denies any new injury.  Denies paresthesias.  Denies any issues with urination or bowel movements  HPI  Past Medical History:  Diagnosis Date  . Acne   . Vision abnormalities     Patient Active Problem List   Diagnosis Date Noted  . Acute midline thoracic back pain 05/26/2018  . Adjustment disorder with anxious mood 08/17/2017  . Elevated liver enzymes 05/10/2017  . Disordered eating 04/06/2017  . Bunion of left foot 03/04/2016  . Other seasonal allergic rhinitis 02/25/2016  . Acne     History reviewed. No pertinent surgical history.  OB History   No obstetric history on file.      Home Medications    Prior to Admission medications   Medication Sig Start Date End Date Taking? Authorizing Provider  cetirizine (ZYRTEC) 10 MG tablet Take 1 tablet by mouth once daily 02/11/19   Ettefagh, Aron Baba, MD  Ergocalciferol (VITAMIN D2) 400 units TABS Take 200 mg by mouth.    [provider]  FLUoxetine (PROZAC) 20 MG capsule Take 1 capsule (20 mg total) by mouth daily. 08/25/18   Georges Mouse, NP  methylPREDNISolone (MEDROL DOSEPAK) 4 MG TBPK tablet Take as directed 05/31/20   Lew Dawes, PA-C    Family History Family History  Problem Relation Age of Onset  . Anxiety disorder Father        Was on medicine but discontinued  because did not want to be dependent on medicine  . Depression Father   . Tuberculosis Maternal Grandmother   . Diabetes Paternal Grandmother   . Diabetes Paternal Grandfather     Social History Social History   Tobacco Use  . Smoking status: Never Smoker  . Smokeless tobacco: Never Used  Substance Use Topics  . Alcohol use: Not on file  . Drug use: Not on file     Allergies   Patient has no known allergies.   Review of Systems Review of Systems  Constitutional: Negative for fatigue and fever.  Eyes: Negative for visual disturbance.  Respiratory: Negative for shortness of breath.   Cardiovascular: Negative for chest pain.  Gastrointestinal: Negative for abdominal pain, nausea and vomiting.  Musculoskeletal: Positive for back pain. Negative for arthralgias and joint swelling.  Skin: Negative for color change, rash and wound.  Neurological: Negative for dizziness, weakness, light-headedness and headaches.     Physical Exam Triage Vital Signs ED Triage Vitals  Enc Vitals Group     BP 05/31/20 1507 104/61     Pulse Rate 05/31/20 1507 60     Resp 05/31/20 1507 16     Temp 05/31/20 1507 98.2 F (36.8 C)     Temp Source 05/31/20 1507 Oral     SpO2 05/31/20 1507 100 %  Weight --      Height --      Head Circumference --      Peak Flow --      Pain Score 05/31/20 1506 8     Pain Loc --      Pain Edu? --      Excl. in GC? --    No data found.  Updated Vital Signs BP 104/61 (BP Location: Left Arm)   Pulse 60   Temp 98.2 F (36.8 C) (Oral)   Resp 16   SpO2 100%   Visual Acuity Right Eye Distance:   Left Eye Distance:   Bilateral Distance:    Right Eye Near:   Left Eye Near:    Bilateral Near:     Physical Exam Vitals and nursing note reviewed.  Constitutional:      Appearance: She is well-developed.     Comments: No acute distress  HENT:     Head: Normocephalic and atraumatic.     Nose: Nose normal.  Eyes:     Conjunctiva/sclera: Conjunctivae  normal.  Cardiovascular:     Rate and Rhythm: Normal rate.  Pulmonary:     Effort: Pulmonary effort is normal. No respiratory distress.  Abdominal:     General: There is no distension.  Musculoskeletal:        General: Normal range of motion.     Cervical back: Neck supple.     Comments: Nontender palpation of cervical spine, tender to palpation to mid/lower thoracic spine midline as well as paraspinal lower thoracic musculature bilaterally  Full active range of motion of upper and lower extremities, strength intact  Skin:    General: Skin is warm and dry.  Neurological:     Mental Status: She is alert and oriented to person, place, and time.      UC Treatments / Results  Labs (all labs ordered are listed, but only abnormal results are displayed) Labs Reviewed - No data to display  EKG   Radiology No results found.  Procedures Procedures (including critical care time)  Medications Ordered in UC Medications - No data to display  Initial Impression / Assessment and Plan / UC Course  I have reviewed the triage vital signs and the nursing notes.  Pertinent labs & imaging results that were available during my care of the patient were reviewed by me and considered in my medical decision making (see chart for details).     Suspect likely MSK etiology, no maximum injury, do not suspect acute bony abnormality.  Similar to prior back pain which improved with steroids and PT.  Will place on Medrol Dosepak and activity modification.  Recommended to monitor and follow-up with orthopedics if becoming recurrent again.  Discussed strict return precautions. Patient verbalized understanding and is agreeable with plan.  Final Clinical Impressions(s) / UC Diagnoses   Final diagnoses:  Acute midline thoracic back pain     Discharge Instructions     Medrol dose pack prescribed- take with food Avoid heavy lifting, avoid complete bed rest Follow up if not improving or  worsening   ED Prescriptions    Medication Sig Dispense Auth. Provider   methylPREDNISolone (MEDROL DOSEPAK) 4 MG TBPK tablet Take as directed 21 tablet Izabela Ow C, PA-C     PDMP not reviewed this encounter.   Lew Dawes, PA-C 06/01/20 1128

## 2020-06-21 ENCOUNTER — Other Ambulatory Visit: Payer: Self-pay

## 2020-06-21 ENCOUNTER — Ambulatory Visit (HOSPITAL_COMMUNITY)
Admission: EM | Admit: 2020-06-21 | Discharge: 2020-06-21 | Disposition: A | Discharge status: Home / Self Care | Payer: Medicaid Other | Attending: Urgent Care | Admitting: Urgent Care

## 2020-06-21 ENCOUNTER — Encounter (HOSPITAL_COMMUNITY): Payer: Self-pay | Admitting: Urgent Care

## 2020-06-21 DIAGNOSIS — Z1152 Encounter for screening for COVID-19: Secondary | ICD-10-CM

## 2020-06-21 DIAGNOSIS — Z20822 Contact with and (suspected) exposure to covid-19: Secondary | ICD-10-CM | POA: Insufficient documentation

## 2020-06-21 LAB — SARS CORONAVIRUS 2 (TAT 6-24 HRS): SARS Coronavirus 2: NEGATIVE

## 2020-06-21 NOTE — ED Provider Notes (Signed)
[facility]   MRN: [id number] DOB: 2000-12-24  Subjective:   Jean Thompson is a 19 y.o. female presenting for COVID-19 screening for travel.  No current facility-administered medications for this encounter.  Current Outpatient Medications:  .  cetirizine (ZYRTEC) 10 MG tablet, Take 1 tablet by mouth once daily, Disp: 30 tablet, Rfl: 0 .  Ergocalciferol (VITAMIN D2) 400 units TABS, Take 200 mg by mouth., Disp: , Rfl:  .  FLUoxetine (PROZAC) 20 MG capsule, Take 1 capsule (20 mg total) by mouth daily., Disp: 30 capsule, Rfl: 3 .  methylPREDNISolone (MEDROL DOSEPAK) 4 MG TBPK tablet, Take as directed, Disp: 21 tablet, Rfl: 0   No Known Allergies  Past Medical History:  Diagnosis Date  . Acne   . Vision abnormalities      No past surgical history on file.  Family History  Problem Relation Age of Onset  . Anxiety disorder Father        Was on medicine but discontinued because did not want to be dependent on medicine  . Depression Father   . Tuberculosis Maternal Grandmother   . Diabetes Paternal Grandmother   . Diabetes Paternal Grandfather     Social History   Tobacco Use  . Smoking status: Never Smoker  . Smokeless tobacco: Never Used  Substance Use Topics  . Alcohol use: Not on file  . Drug use: Not on file    Review of Systems  Constitutional: Negative for fever and malaise/fatigue.  HENT: Negative for congestion, ear pain, sinus pain and sore throat.   Eyes: Negative for blurred vision, double vision, discharge and redness.  Respiratory: Negative for cough, hemoptysis, shortness of breath and wheezing.   Cardiovascular: Negative for chest pain.  Gastrointestinal: Negative for abdominal pain, diarrhea, nausea and vomiting.  Genitourinary: Negative for dysuria, flank pain and hematuria.  Musculoskeletal: Negative for myalgias.  Skin: Negative for rash.  Neurological: Negative for dizziness, weakness and headaches.  Psychiatric/Behavioral:  Negative for depression and substance abuse.     Objective:   Vitals: BP (!) 108/56 (BP Location: Left Arm)   Pulse 72   Resp 16   SpO2 100%   Physical Exam Constitutional:      General: She is not in acute distress.    Appearance: Normal appearance. She is well-developed. She is not ill-appearing, toxic-appearing or diaphoretic.  HENT:     Head: Normocephalic and atraumatic.     Nose: Nose normal.     Mouth/Throat:     Mouth: Mucous membranes are moist.     Pharynx: Oropharynx is clear.  Eyes:     General: No scleral icterus.       Right eye: No discharge.        Left eye: No discharge.     Extraocular Movements: Extraocular movements intact.     Conjunctiva/sclera: Conjunctivae normal.     Pupils: Pupils are equal, round, and reactive to light.  Cardiovascular:     Rate and Rhythm: Normal rate.  Pulmonary:     Effort: Pulmonary effort is normal.  Skin:    General: Skin is warm and dry.  Neurological:     General: No focal deficit present.     Mental Status: She is alert and oriented to person, place, and time.  Psychiatric:        Mood and Affect: Mood normal.        Behavior: Behavior normal.        Thought Content: Thought content normal.  Judgment: Judgment normal.      Assessment and Plan :   PDMP not reviewed this encounter.  1. Encounter for screening for COVID-19     Counseled patient on nature of COVID-19 including modes of transmission, diagnostic testing, management and supportive care.  Counseled on medications used for symptomatic relief. COVID 19 testing is pending. Counseled patient on potential for adverse effects with medications prescribed/recommended today, ER and return-to-clinic precautions discussed, patient verbalized understanding.     Wallis Bamberg, [place] 06/21/20 1703

## 2020-06-21 NOTE — Discharge Instructions (Signed)

## 2020-06-30 ENCOUNTER — Ambulatory Visit: Payer: Medicaid Other | Admitting: Family Medicine

## 2020-07-15 NOTE — Patient Instructions (Addendum)
It was great to meet you!  Our plans for today:  -We discussed your back pain from your previous soccer injury.  I would like for you to meet with physical therapy to be evaluated and determine if any other exercise may benefit you.  You can use ibuprofen 400 mg every 6 hours on days that your back pain is bothering you more.  If physical therapy does not help your back pain or your back pain worsens I would like for you to make an appointment to follow-up. -I am checking some blood work today because she had some elevated liver enzymes in the past.  I expect that these will be returned to normal but would like to check to confirm.  I will let you know the results as they come in.   Take care and seek immediate care sooner if you develop any concerns.   Dr. [facility]

## 2020-07-15 NOTE — Progress Notes (Addendum)
Subjective:    Patient ID: Jean Thompson, female    DOB: Aug 31, 2001, 19 y.o.   MRN: [id number]   CC: Establish care  HPI:  Jean Thompson is a very pleasant 19 y.o. female who presents today to establish care.  Initial concerns: Low back pain - Started 4-5 years ago after a soccer injury. Went to sports medicine and had xrays which showed no acute bony abnormality. Has improved since then but started worsening last year. Pain doesn't radiate. Worse with exercise. Went to urgent care a few weeks ago and got prednisone.  No recent injuries, though patient does work out 3 times per week and is active in college and states that this seems to aggravate the back pain.  She previously saw physical therapy and got some benefit from this but has not been in some time.  Past medical history: Hx of seasonal allergies, previous midthoracic back injury from a soccer injury several years ago, history of an eating disorder which has since resolved.  Past surgical history: None  Current medications: Zyrtec for allergies, recently got a Medrol Dosepak for back pain at urgent care  Family history: History of anxiety/depression in patient's father, history of diabetes in paternal grandmother/grandfather  Social history: In college, exercises 3x per week with resistance training. No alcohol, no tobacco, no recreational drugs.  ROS: pertinent noted in the HPI   Objective:  BP 105/70    Pulse 73    Ht 5\' 4"  (1.626 m)    Wt 130 lb 9.6 oz (59.2 kg)    SpO2 97%    BMI 22.42 kg/m   Vitals and nursing note reviewed   Office Visit from 07/16/2020 in [facility]  PHQ-9 Total Score 6     General: NAD, pleasant, able to participate in exam Cardiac: RRR, S1 S2 present. normal heart sounds, no murmurs. Respiratory: CTAB, normal effort, No wheezes, rales or rhonchi Abdomen: Bowel sounds present, non-tender, non-distended, no hepatosplenomegaly Extremities: no edema or  cyanosis. MSK: Left latissimus dorsi and left aspect of the rhomboid musculature with increased tone and some discomfort to palpation.  Patient with some discomfort to palpation around mid thoracic spine more so on the left. Neuro: alert, no obvious focal deficits Psych: Normal affect and mood   Assessment & Plan:    Contraception management Assessment: 19 year old female with a Nexplanon which was placed on 09/20/2019 with the next one planned for 09/19/2022.  No concerns today. Plan: -We will plan to replace Nexplanon or other contraceptive options when Nexplanon expires on 09/19/2022.  Chronic midline thoracic back pain Assessment: Chronic midthoracic back pain which initiated after soccer injury several years ago.  Patient previously saw physical therapy and got some benefit with this with the back pain improving, though with her increasingly active lifestyle this back pain has started to come back over the past months/years.  Patient previously had x-rays which did not show any acute bony abnormality.  On physical exam patient does have some tenderness in the midthoracic area as well as some hypertonicity of the musculature in the region of the latissimus dorsi and rhomboids on the left side.  Patient without radiation of pain.  No recent injuries or concerns for repeat imaging at this time.  Patient's pain may be due to the hypertonicity of the musculature and may benefit from some stretching and strengthening exercises.  With no recent injury and no pain radiating anywhere do not feel the need for repeat imaging. Plan: -Recommended ibuprofen  400 mg every 6 hours as needed for pain when patient has heavier workouts or soccer matches -Recommended avoiding any exercise that causes pain to the area but discussed that patient may benefit from some back strengthening exercises if she can do this without creating more pain -Refer patient to physical therapy for evaluation and consideration of  strengthening and flexibility exercises -Recommend follow-up in the next 8 to 12 weeks if does not benefit from physical therapy or sooner if pain worsens.    Jean Poling, DO [facility] PGY-1

## 2020-07-16 ENCOUNTER — Ambulatory Visit (INDEPENDENT_AMBULATORY_CARE_PROVIDER_SITE_OTHER): Payer: Medicaid Other | Admitting: Family Medicine

## 2020-07-16 ENCOUNTER — Other Ambulatory Visit: Payer: Self-pay

## 2020-07-16 ENCOUNTER — Encounter: Payer: Self-pay | Admitting: Family Medicine

## 2020-07-16 VITALS — BP 105/70 | HR 73 | Ht 64.0 in | Wt 130.6 lb

## 2020-07-16 DIAGNOSIS — R748 Abnormal levels of other serum enzymes: Secondary | ICD-10-CM | POA: Diagnosis not present

## 2020-07-16 DIAGNOSIS — G8929 Other chronic pain: Secondary | ICD-10-CM

## 2020-07-16 DIAGNOSIS — M546 Pain in thoracic spine: Secondary | ICD-10-CM

## 2020-07-16 DIAGNOSIS — Z309 Encounter for contraceptive management, unspecified: Secondary | ICD-10-CM | POA: Diagnosis not present

## 2020-07-16 NOTE — Assessment & Plan Note (Signed)
Assessment: Chronic midthoracic back pain which initiated after soccer injury several years ago.  Patient previously saw physical therapy and got some benefit with this with the back pain improving, though with her increasingly active lifestyle this back pain has started to come back over the past months/years.  Patient previously had x-rays which did not show any acute bony abnormality.  On physical exam patient does have some tenderness in the midthoracic area as well as some hypertonicity of the musculature in the region of the latissimus dorsi and rhomboids on the left side.  Patient without radiation of pain.  No recent injuries or concerns for repeat imaging at this time.  Patient's pain may be due to the hypertonicity of the musculature and may benefit from some stretching and strengthening exercises.  With no recent injury and no pain radiating anywhere do not feel the need for repeat imaging. Plan: -Recommended ibuprofen 400 mg every 6 hours as needed for pain when patient has heavier workouts or soccer matches -Recommended avoiding any exercise that causes pain to the area but discussed that patient may benefit from some back strengthening exercises if she can do this without creating more pain -Refer patient to physical therapy for evaluation and consideration of strengthening and flexibility exercises -Recommend follow-up in the next 8 to 12 weeks if does not benefit from physical therapy or sooner if pain worsens.

## 2020-07-16 NOTE — Assessment & Plan Note (Signed)
Assessment: 19 year old female with a Nexplanon which was placed on 09/20/2019 with the next one planned for 09/19/2022.  No concerns today. Plan: -We will plan to replace Nexplanon or other contraceptive options when Nexplanon expires on 09/19/2022.

## 2020-07-16 NOTE — Assessment & Plan Note (Signed)
Assessment: Previously elevated LFTs in 2018 with AST 272, ALT 318.  Later in 2018 these numbers had improved to close to normal levels.  Patient without any complaints or concerns at this time.  Does not drink alcohol or take high-dose acetaminophen.  We will recheck CMP today to ensure has resolved to normal limits. Plan: -Repeat CMP to ensure LFTs have resumed to normal limits.

## 2020-07-17 LAB — COMPREHENSIVE METABOLIC PANEL
ALT: 24 IU/L (ref 0–32)
AST: 35 IU/L (ref 0–40)
Albumin/Globulin Ratio: 1.7 (ref 1.2–2.2)
Albumin: 4.7 g/dL (ref 3.9–5.0)
Alkaline Phosphatase: 61 IU/L (ref 45–106)
BUN/Creatinine Ratio: 21 (ref 9–23)
BUN: 15 mg/dL (ref 6–20)
Bilirubin Total: 0.4 mg/dL (ref 0.0–1.2)
CO2: 20 mmol/L (ref 20–29)
Calcium: 9.6 mg/dL (ref 8.7–10.2)
Chloride: 103 mmol/L (ref 96–106)
Creatinine, Ser: 0.71 mg/dL (ref 0.57–1.00)
GFR calc Af Amer: 142 mL/min/{1.73_m2} (ref >59)
GFR calc non Af Amer: 123 mL/min/{1.73_m2} (ref >59)
Globulin, Total: 2.8 g/dL (ref 1.5–4.5)
Glucose: 93 mg/dL (ref 65–99)
Potassium: 4 mmol/L (ref 3.5–5.2)
Sodium: 139 mmol/L (ref 134–144)
Total Protein: 7.5 g/dL (ref 6.0–8.5)

## 2020-08-07 ENCOUNTER — Ambulatory Visit: Payer: Medicaid Other | Admitting: Physical Therapy

## 2020-08-19 ENCOUNTER — Ambulatory Visit: Payer: Medicaid Other

## 2020-09-08 ENCOUNTER — Ambulatory Visit: Payer: Medicaid Other | Attending: Family Medicine

## 2021-04-09 DIAGNOSIS — H538 Other visual disturbances: Secondary | ICD-10-CM | POA: Diagnosis not present

## 2021-04-22 NOTE — Progress Notes (Signed)
    SUBJECTIVE:   CHIEF COMPLAINT / HPI:   Discuss Nexplanon removal and other options for contraception: Patient is a 20 year old female that presents today to discuss Nexplanon removal as well as other possible options for contraception.  Her Nexplanon was placed on 09/20/2019.  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  Patient states she is not currently in a relationship and is interested in doing a break from the Nexplanon to see if her fatigue was related to it or not.  PERTINENT  PMH / PSH: None relevant  OBJECTIVE:   BP 110/60   Pulse 67   Ht 5\' 4"  (1.626 m)   Wt 130 lb 12.8 oz (59.3 kg)   SpO2 98%   BMI 22.45 kg/m    General: Alert and oriented in no apparent distress Lungs: No respiratory distress Skin: Warm and dry, Nexplanon noted by palpation in the left upper extremity Extremities: No lower extremity edema  ASSESSMENT/PLAN:   Contraception management Assessment: 20 year old female presenting to discuss possibility of removing her Nexplanon as well as other options for contraception.  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  Options were discussed including oral contraceptives, IUD, continue Nexplanon, injection.  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  I did discuss with her that I recommend resuming contraception assents possible if she thinks there is any concern of a pregnancy, unless she is interested in a pregnancy and the patient voiced her understanding.  She is going make a follow-up appointment to have the Nexplanon removed.  I recommended that she keep a journal of her fatigue and other symptoms to compare to the time right after it is removed to see if it may  be related, because if it is not it may still be a viable option for contraception for her.    36, DO [facility]    This note was prepared using Dragon voice recognition software and may include unintentional dictation errors due to the inherent limitations of voice recognition software.

## 2021-04-23 ENCOUNTER — Ambulatory Visit (INDEPENDENT_AMBULATORY_CARE_PROVIDER_SITE_OTHER): Payer: Medicaid Other | Admitting: Family Medicine

## 2021-04-23 ENCOUNTER — Other Ambulatory Visit: Payer: Self-pay

## 2021-04-23 ENCOUNTER — Encounter: Payer: Self-pay | Admitting: Family Medicine

## 2021-04-23 DIAGNOSIS — Z309 Encounter for contraceptive management, unspecified: Secondary | ICD-10-CM | POA: Diagnosis not present

## 2021-04-23 NOTE — Assessment & Plan Note (Signed)
Assessment: 20 year old female presenting to discuss possibility of removing her Nexplanon as well as other options for contraception.  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  Options were discussed including oral contraceptives, IUD, continue Nexplanon, injection.  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  I did discuss with her that I recommend resuming contraception assents possible if she thinks there is any concern of a pregnancy, unless she is interested in a pregnancy and the patient voiced her understanding.  She is going make a follow-up appointment to have the Nexplanon removed.  I recommended that she keep a journal of her fatigue and other symptoms to compare to the time right after it is removed to see if it may be related, because if it is not it may still be a viable option for contraception for her.

## 2021-05-03 NOTE — Patient Instructions (Addendum)
Today we removed your nexplanon. If you have pain/discomfort you can use tylenol or ibuprofen.  We placed a bandage to help limit bruising, you can remove this bandage in 24 hours and can shower as normal.  Do not soak the wound in water such as a swimming pool, etc. until it has healed.  If you desire protection from pregnancy in the future please let us know and we can discuss other options. We can always replace the nexplanon if you desire as well.

## 2021-05-03 NOTE — Progress Notes (Signed)
    SUBJECTIVE:   CHIEF COMPLAINT / HPI:   Encounter for Nexplanon removal: Patient is a 20 year old female who presents today for Nexplanon removal.  She had previous appointment with me where we discussed the multiple modalities for contraception.  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  At the end of last appointment she decided that she was not interested in other contraception at this time but understands that a pregnancy could occur if she is not taking contraception.  PERTINENT  PMH / PSH: None relevant  OBJECTIVE:   BP 108/68   Pulse 74   Ht 5\' 4"  (1.626 m)   Wt 132 lb (59.9 kg)   SpO2 98%   BMI 22.66 kg/m   General: NAD, pleasant, able to participate in exam Respiratory: No respiratory distress Skin: Nexplanon palpated in left upper arm. Psych: Normal affect and mood   Nexplanon Removal Procedure Note  PRE-OP DIAGNOSIS: Nexplanon, desire for removal  POST-OP DIAGNOSIS: Same  PROCEDURE: Nexplanon Removal  Performing Physician: PROCEDURE:  Informed consent was given, signed and placed in chart. A time-out was performed prior to initiating procedure to be sure of correct patient and location. The area surrounding the Nexplanon was prepared with Choloraprep and draped in the usual sterile manner. The site was anesthetized with 3.5cc lidocaine with epi. A skin incision was made over the distal aspect of the device. The capsule lysed sharply and the device removed using a hemostat. Hemostasis was assured. The site was dressed with SteriStrips and a pressure dressing. The patient tolerated the procedure well.  No complications.  ASSESSMENT/PLAN:   Contraception management 20 yo presenting for nexplanon removal due to fatigue while on nexplanon in order to see if her symptoms improve/resolve after  removal.  Previously discussed other birth control options at her last appointment. At this time she desires none, she understands that pregnancy could occur without contraception. Nexplanon was removed without complication, see procedure note above.  Follow-up as needed   36, DO [facility]

## 2021-05-04 ENCOUNTER — Other Ambulatory Visit: Payer: Self-pay

## 2021-05-04 ENCOUNTER — Encounter: Payer: Self-pay | Admitting: Family Medicine

## 2021-05-04 ENCOUNTER — Ambulatory Visit (INDEPENDENT_AMBULATORY_CARE_PROVIDER_SITE_OTHER): Payer: Medicaid Other | Admitting: Family Medicine

## 2021-05-04 VITALS — BP 108/68 | HR 74 | Ht 64.0 in | Wt 132.0 lb

## 2021-05-04 DIAGNOSIS — Z308 Encounter for other contraceptive management: Secondary | ICD-10-CM | POA: Diagnosis not present

## 2021-05-04 DIAGNOSIS — Z3046 Encounter for surveillance of implantable subdermal contraceptive: Secondary | ICD-10-CM

## 2021-05-04 NOTE — Assessment & Plan Note (Addendum)
20 yo presenting for nexplanon removal due to fatigue while on nexplanon in order to see if her symptoms improve/resolve after removal.  Previously discussed other birth control options at her last appointment. At this time she desires none, she understands that pregnancy could occur without contraception. Nexplanon was removed without complication, see procedure note above.  Follow-up as needed

## 2021-09-24 ENCOUNTER — Other Ambulatory Visit: Payer: Self-pay

## 2021-09-24 ENCOUNTER — Emergency Department (HOSPITAL_COMMUNITY)
Admission: EM | Admit: 2021-09-24 | Discharge: 2021-09-24 | Disposition: A | Discharge status: Home / Self Care | Payer: Medicaid Other | Attending: Emergency Medicine | Admitting: Emergency Medicine

## 2021-09-24 ENCOUNTER — Encounter (HOSPITAL_COMMUNITY): Payer: Self-pay | Admitting: *Deleted

## 2021-09-24 DIAGNOSIS — H9203 Otalgia, bilateral: Secondary | ICD-10-CM | POA: Diagnosis not present

## 2021-09-24 DIAGNOSIS — R0981 Nasal congestion: Secondary | ICD-10-CM | POA: Diagnosis not present

## 2021-09-24 DIAGNOSIS — Z20822 Contact with and (suspected) exposure to covid-19: Secondary | ICD-10-CM | POA: Diagnosis not present

## 2021-09-24 DIAGNOSIS — J3489 Other specified disorders of nose and nasal sinuses: Secondary | ICD-10-CM | POA: Insufficient documentation

## 2021-09-24 DIAGNOSIS — J029 Acute pharyngitis, unspecified: Secondary | ICD-10-CM | POA: Diagnosis not present

## 2021-09-24 LAB — RESP PANEL BY RT-PCR (FLU A&B, COVID) ARPGX2
Influenza A by PCR: NEGATIVE
Influenza B by PCR: NEGATIVE
SARS Coronavirus 2 by RT PCR: NEGATIVE

## 2021-09-24 LAB — GROUP A STREP BY PCR: Group A Strep by PCR: NOT DETECTED

## 2021-09-24 MED ORDER — FLUTICASONE PROPIONATE 50 MCG/ACT NA SUSP: 1.0000 | Freq: Every day | NASAL | 2 refills | Status: DC

## 2021-09-24 MED ORDER — GUAIFENESIN ER 600 MG PO TB12: 600.0000 mg | ORAL_TABLET | Freq: Two times a day (BID) | ORAL | 0 refills | Status: DC

## 2021-09-24 NOTE — Discharge Instructions (Signed)
Strep, covid, and flu testing are negative.  This is likely viral infection. Take the prescribed medication as directed.  Make sure to rest and stay hydrated. Follow-up with your primary care doctor. Return to the ED for new or worsening symptoms.

## 2021-09-24 NOTE — ED Notes (Signed)
RN reviewed discharge instructions with pt. Pt verbalized understanding and had no further questions. VSS upon discharge.  

## 2021-09-24 NOTE — ED Triage Notes (Signed)
C/o sore throat onset yest , c/o bilateral earache woke her from sleep

## 2021-09-24 NOTE — ED Provider Notes (Signed)
[facility] EMERGENCY DEPARTMENT Provider Note   CSN: [id number] Arrival date & time: 09/24/21  0407     History Chief Complaint  Patient presents with   Sore Throat   Headache   Otalgia    Jean Thompson is a 20 y.o. female.  The history is provided by the patient and medical records.  Sore Throat Associated symptoms include headaches.  Headache Associated symptoms: congestion and ear pain   Otalgia Associated symptoms: congestion and headaches    20 year old female presenting to the ED for sore throat, bilateral ear pain, headache, nasal congestion for the past 3 days.  Little sister has been sick with a cold recently as well.  She has not had any fever or cough.  She denies any abdominal pain, vomiting, or diarrhea.  States she feels a lot worse now.  She is not been taking medications at home for her symptoms.  Past Medical History:  Diagnosis Date   Acne    Vision abnormalities     Patient Active Problem List   Diagnosis Date Noted   Contraception management 07/16/2020   Chronic midline thoracic back pain 05/26/2018   Adjustment disorder with anxious mood 08/17/2017   Elevated liver enzymes 05/10/2017   Bunion of left foot 03/04/2016   Other seasonal allergic rhinitis 02/25/2016    History reviewed. No pertinent surgical history.   OB History   No obstetric history on file.     Family History  Problem Relation Age of Onset   Anxiety disorder Father        Was on medicine but discontinued because did not want to be dependent on medicine   Depression Father    Tuberculosis Maternal Grandmother    Diabetes Paternal Grandmother    Diabetes Paternal Grandfather     Social History   Tobacco Use   Smoking status: Never   Smokeless tobacco: Never  Substance Use Topics   Alcohol use: Never    Home Medications Prior to Admission medications   Medication Sig Start Date End Date Taking? Authorizing Provider  fluticasone  (FLONASE) 50 MCG/ACT nasal spray Place 1 spray into both nostrils daily. 09/24/21  Yes Garlon Hatchet, PA-C  guaiFENesin (MUCINEX) 600 MG 12 hr tablet Take 1 tablet (600 mg total) by mouth 2 (two) times daily. 09/24/21  Yes Garlon Hatchet, PA-C  cetirizine (ZYRTEC) 10 MG tablet Take 1 tablet by mouth once daily 02/11/19   Ettefagh, Aron Baba, MD  Ergocalciferol (VITAMIN D2) 400 units TABS Take 200 mg by mouth.    [provider]  methylPREDNISolone (MEDROL DOSEPAK) 4 MG TBPK tablet Take as directed Patient not taking: Reported on 07/16/2020 05/31/20   Wieters, Fran Lowes C, PA-C    Allergies    Patient has no known allergies.  Review of Systems   Review of Systems  HENT:  Positive for congestion and ear pain.   Neurological:  Positive for headaches.  All other systems reviewed and are negative.  Physical Exam Updated Vital Signs BP 117/71 (BP Location: Right Arm)   Pulse 87   Temp 98.2 F (36.8 C) (Oral)   Resp 18   Ht 5\' 4"  (1.626 m)   Wt 59.9 kg   SpO2 98%   BMI 22.66 kg/m   Physical Exam Vitals and nursing note reviewed.  Constitutional:      Appearance: She is well-developed.  HENT:     Head: Normocephalic and atraumatic.     Right Ear: Tympanic membrane and ear  canal normal.     Left Ear: Tympanic membrane and ear canal normal.     Nose: Congestion and rhinorrhea present. Rhinorrhea is clear.     Mouth/Throat:     Lips: Pink.     Mouth: Mucous membranes are moist.     Comments: Tonsils overall normal in appearance bilaterally without exudate; uvula midline without evidence of peritonsillar abscess; handling secretions appropriately; no difficulty swallowing or speaking; normal phonation without stridor Eyes:     Conjunctiva/sclera: Conjunctivae normal.     Pupils: Pupils are equal, round, and reactive to light.  Cardiovascular:     Rate and Rhythm: Normal rate and regular rhythm.     Heart sounds: Normal heart sounds.  Pulmonary:     Effort: Pulmonary effort  is normal.     Breath sounds: Normal breath sounds.  Abdominal:     General: Bowel sounds are normal.     Palpations: Abdomen is soft.  Musculoskeletal:        General: Normal range of motion.     Cervical back: Normal range of motion.  Skin:    General: Skin is warm and dry.  Neurological:     Mental Status: She is alert and oriented to person, place, and time.    ED Results / Procedures / Treatments   Labs (all labs ordered are listed, but only abnormal results are displayed) Labs Reviewed  GROUP A STREP BY PCR  RESP PANEL BY RT-PCR (FLU A&B, COVID) [id number]    EKG None  Radiology No results found.  Procedures Procedures   Medications Ordered in ED Medications - No data to display  ED Course  I have reviewed the triage vital signs and the nursing notes.  Pertinent labs & imaging results that were available during my care of the patient were reviewed by me and considered in my medical decision making (see chart for details).    MDM Rules/Calculators/A&P                           20 y.o. F here with a few days of core throat, congestion, headaches, ear pain.  Little sister sick with same.  Afebrile, non-toxic.  Does have some congestion and rhinorrhea on exam but no tonsillar edema or exudates.  Strep, covid, flu testing all negative.  Suspect other viral process.  Plan to d/c home with symptomatic care and PCP follow-up.  Return here for new concerns.  Final Clinical Impression(s) / ED Diagnoses Final diagnoses:  Nasal congestion  Sore throat    Rx / DC Orders ED Discharge Orders          Ordered    guaiFENesin (MUCINEX) 600 MG 12 hr tablet  2 times daily        09/24/21 0546    fluticasone (FLONASE) 50 MCG/ACT nasal spray  Daily        09/24/21 0546             Garlon Hatchet, PA-C 09/24/21 1607    Tilden Fossa, MD 09/24/21 0600

## 2021-09-26 ENCOUNTER — Encounter (HOSPITAL_COMMUNITY): Payer: Self-pay

## 2021-09-26 ENCOUNTER — Other Ambulatory Visit: Payer: Self-pay

## 2021-09-26 ENCOUNTER — Ambulatory Visit (HOSPITAL_COMMUNITY)
Admission: EM | Admit: 2021-09-26 | Discharge: 2021-09-26 | Disposition: A | Discharge status: Home / Self Care | Payer: Medicaid Other | Attending: Student | Admitting: Student

## 2021-09-26 DIAGNOSIS — J0100 Acute maxillary sinusitis, unspecified: Secondary | ICD-10-CM

## 2021-09-26 MED ORDER — AMOXICILLIN-POT CLAVULANATE 875-125 MG PO TABS: 1.0000 | ORAL_TABLET | Freq: Two times a day (BID) | ORAL | 0 refills | Status: DC

## 2021-09-26 NOTE — ED Triage Notes (Signed)
Pt reports nasal congestion, headache x 1 week; left ear pain x 3 days.   Reports negative COVID and Flu, done at the ED 3 days ago.

## 2021-09-26 NOTE — Discharge Instructions (Addendum)
-  Start the antibiotic-Augmentin (amoxicillin-clavulanate), 1 pill every 12 hours for 7 days.  You can take this with food like with breakfast and dinner. -Continue nasal steroid, OTC decongestants, tylenol/ibuprofen -You can take Tylenol up to 1000 mg 3 times daily, and ibuprofen up to 600 mg 3 times daily with food.  You can take these together, or alternate every 3-4 hours. -Follow-up if symptoms getting worse instead of better

## 2021-09-26 NOTE — ED Provider Notes (Signed)
[name]    CSN: [id number] Arrival date & time: 09/26/21  1020      History   Chief Complaint Chief Complaint  Patient presents with   Headache   Nasal Congestion         HPI Jean Thompson is a 20 y.o. female presenting with viral syndrome for 1 week with 2 days of L maxillary sinus and ear pain. Medical history noncontributory.  Describes nasal congestion, cough, headaches, body aches for about 1 week, symptoms all improving except for the facial and ear pain which is getting worse.  Was evaluated in the emergency department 3 days ago and they tested for COVID, flu, strep which were all negative.  Patient states that she has been taking an over-the-counter decongestant and nasal spray, minimal improvement with this.  Ibuprofen for pain.  HPI  Past Medical History:  Diagnosis Date   Acne    Vision abnormalities     Patient Active Problem List   Diagnosis Date Noted   Contraception management 07/16/2020   Chronic midline thoracic back pain 05/26/2018   Adjustment disorder with anxious mood 08/17/2017   Elevated liver enzymes 05/10/2017   Bunion of left foot 03/04/2016   Other seasonal allergic rhinitis 02/25/2016    History reviewed. No pertinent surgical history.  OB History   No obstetric history on file.      Home Medications    Prior to Admission medications   Medication Sig Start Date End Date Taking? Authorizing Provider  amoxicillin-clavulanate (AUGMENTIN) 875-125 MG tablet Take 1 tablet by mouth every 12 (twelve) hours. 09/26/21  Yes Hazel Sams, PA-C  cetirizine (ZYRTEC) 10 MG tablet Take 1 tablet by mouth once daily 02/11/19   Ettefagh, Paul Dykes, MD  Ergocalciferol (VITAMIN D2) 400 units TABS Take 200 mg by mouth.    [provider]  fluticasone (FLONASE) 50 MCG/ACT nasal spray Place 1 spray into both nostrils daily. 09/24/21   Larene Pickett, PA-C  guaiFENesin (MUCINEX) 600 MG 12 hr tablet Take 1 tablet (600 mg  total) by mouth 2 (two) times daily. 09/24/21   Larene Pickett, PA-C  methylPREDNISolone (MEDROL DOSEPAK) 4 MG TBPK tablet Take as directed Patient not taking: No sig reported 05/31/20   Wieters, Elesa Hacker, PA-C    Family History Family History  Problem Relation Age of Onset   Anxiety disorder Father        Was on medicine but discontinued because did not want to be dependent on medicine   Depression Father    Tuberculosis Maternal Grandmother    Diabetes Paternal Grandmother    Diabetes Paternal Grandfather     Social History Social History   Tobacco Use   Smoking status: Never   Smokeless tobacco: Never  Substance Use Topics   Alcohol use: Never   Drug use: Never     Allergies   Patient has no known allergies.   Review of Systems Review of Systems  Constitutional:  Negative for appetite change, chills and fever.  HENT:  Positive for congestion, ear pain and sinus pressure. Negative for rhinorrhea, sinus pain and sore throat.   Eyes:  Negative for redness and visual disturbance.  Respiratory:  Positive for cough. Negative for chest tightness, shortness of breath and wheezing.   Cardiovascular:  Negative for chest pain and palpitations.  Gastrointestinal:  Negative for abdominal pain, constipation, diarrhea, nausea and vomiting.  Genitourinary:  Negative for dysuria, frequency and urgency.  Musculoskeletal:  Negative for myalgias.  Neurological:  Negative for dizziness, weakness and headaches.  Psychiatric/Behavioral:  Negative for confusion.   All other systems reviewed and are negative.   Physical Exam Triage Vital Signs ED Triage Vitals  Enc Vitals Group     BP 09/26/21 1140 107/65     Pulse Rate 09/26/21 1140 72     Resp 09/26/21 1140 17     Temp 09/26/21 1140 98 F (36.7 C)     Temp Source 09/26/21 1140 Oral     SpO2 09/26/21 1140 98 %     Weight --      Height --      Head Circumference --      Peak Flow --      Pain Score 09/26/21 1137 8     Pain  Loc --      Pain Edu? --      Excl. in [place]? --    No data found.  Updated Vital Signs BP 107/65 (BP Location: Right Arm)   Pulse 72   Temp 98 F (36.7 C) (Oral)   Resp 17   LMP  (Within Weeks) Comment: 3 weeks  SpO2 98%   Visual Acuity Right Eye Distance:   Left Eye Distance:   Bilateral Distance:    Right Eye Near:   Left Eye Near:    Bilateral Near:     Physical Exam Vitals reviewed.  Constitutional:      General: She is not in acute distress.    Appearance: Normal appearance. She is not ill-appearing.  HENT:     Head: Normocephalic and atraumatic.     Right Ear: Tympanic membrane, ear canal and external ear normal. No tenderness. No middle ear effusion. There is no impacted cerumen. Tympanic membrane is not perforated, erythematous, retracted or bulging.     Left Ear: Ear canal and external ear normal. No tenderness.  No middle ear effusion. There is no impacted cerumen. Tympanic membrane is erythematous and bulging. Tympanic membrane is not perforated or retracted.     Nose: No congestion.     Left Sinus: Maxillary sinus tenderness present.     Mouth/Throat:     Mouth: Mucous membranes are moist.     Pharynx: Uvula midline. No oropharyngeal exudate or posterior oropharyngeal erythema.  Eyes:     Extraocular Movements: Extraocular movements intact.     Pupils: Pupils are equal, round, and reactive to light.  Cardiovascular:     Rate and Rhythm: Normal rate and regular rhythm.     Heart sounds: Normal heart sounds.  Pulmonary:     Effort: Pulmonary effort is normal.     Breath sounds: Normal breath sounds. No decreased breath sounds, wheezing, rhonchi or rales.  Abdominal:     Palpations: Abdomen is soft.     Tenderness: There is no abdominal tenderness. There is no guarding or rebound.  Lymphadenopathy:     Cervical: No cervical adenopathy.     Right cervical: No superficial cervical adenopathy.    Left cervical: No superficial cervical adenopathy.   Neurological:     General: No focal deficit present.     Mental Status: She is alert and oriented to person, place, and time.  Psychiatric:        Mood and Affect: Mood normal.        Behavior: Behavior normal.        Thought Content: Thought content normal.        Judgment: Judgment normal.     UC Treatments /  Results  Labs (all labs ordered are listed, but only abnormal results are displayed) Labs Reviewed - No data to display  EKG   Radiology No results found.  Procedures Procedures (including critical care time)  Medications Ordered in UC Medications - No data to display  Initial Impression / Assessment and Plan / UC Course  I have reviewed the triage vital signs and the nursing notes.  Pertinent labs & imaging results that were available during my care of the patient were reviewed by me and considered in my medical decision making (see chart for details).     This patient is a very pleasant 20 y.o. year old female presenting with L maxillary sinusitis and AOM following viral URI. Today this pt is afebrile nontachycardic nontachypneic, oxygenating well on room air, no wheezes rhonchi or rales. States she is not pregnant or breastfeeding.  Augmentin as below. .   ED return precautions discussed. Patient verbalizes understanding and agreement.    Final Clinical Impressions(s) / UC Diagnoses   Final diagnoses:  Acute non-recurrent maxillary sinusitis     Discharge Instructions      -Start the antibiotic-Augmentin (amoxicillin-clavulanate), 1 pill every 12 hours for 7 days.  You can take this with food like with breakfast and dinner. -Continue nasal steroid, OTC decongestants, tylenol/ibuprofen -You can take Tylenol up to 1000 mg 3 times daily, and ibuprofen up to 600 mg 3 times daily with food.  You can take these together, or alternate every 3-4 hours. -Follow-up if symptoms getting worse instead of better   ED Prescriptions     Medication Sig Dispense  Auth. Provider   amoxicillin-clavulanate (AUGMENTIN) 875-125 MG tablet Take 1 tablet by mouth every 12 (twelve) hours. 14 tablet Rhys Martini, PA-C      PDMP not reviewed this encounter.   Rhys Martini, PA-C 09/26/21 1241

## 2021-10-21 ENCOUNTER — Emergency Department (HOSPITAL_COMMUNITY)
Admission: EM | Admit: 2021-10-21 | Discharge: 2021-10-21 | Disposition: A | Discharge status: Home / Self Care | Payer: Medicaid Other | Attending: Emergency Medicine | Admitting: Emergency Medicine

## 2021-10-21 ENCOUNTER — Emergency Department (HOSPITAL_COMMUNITY): Payer: Medicaid Other

## 2021-10-21 DIAGNOSIS — W228XXA Striking against or struck by other objects, initial encounter: Secondary | ICD-10-CM | POA: Diagnosis not present

## 2021-10-21 DIAGNOSIS — M25111 Fistula, right shoulder: Secondary | ICD-10-CM | POA: Insufficient documentation

## 2021-10-21 DIAGNOSIS — R531 Weakness: Secondary | ICD-10-CM | POA: Diagnosis not present

## 2021-10-21 DIAGNOSIS — M25521 Pain in right elbow: Secondary | ICD-10-CM | POA: Diagnosis not present

## 2021-10-21 DIAGNOSIS — S40021A Contusion of right upper arm, initial encounter: Secondary | ICD-10-CM | POA: Diagnosis not present

## 2021-10-21 DIAGNOSIS — M79621 Pain in right upper arm: Secondary | ICD-10-CM | POA: Diagnosis not present

## 2021-10-21 DIAGNOSIS — S6991XA Unspecified injury of right wrist, hand and finger(s), initial encounter: Secondary | ICD-10-CM | POA: Insufficient documentation

## 2021-10-21 DIAGNOSIS — S4991XA Unspecified injury of right shoulder and upper arm, initial encounter: Secondary | ICD-10-CM

## 2021-10-21 MED ORDER — IBUPROFEN 400 MG PO TABS
600.0000 mg | ORAL_TABLET | Freq: Once | ORAL | Status: AC
  Administered 2021-10-21: 600 mg via ORAL
  Filled 2021-10-21: qty 1
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
## 2021-10-21 NOTE — Consult Note (Signed)
Responded to page, advised pt had no need of chaplain services. Pls page again if situation changes.  Rev. Donnel Saxon Chaplain
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
## 2021-10-21 NOTE — Discharge Instructions (Signed)
Return for any problem.  Apply ice as instructed.  Use sling as instructed.  Take ibuprofen - 600 mg every 8 hours  - as needed for pain.

## 2022-04-27 ENCOUNTER — Encounter: Payer: Self-pay | Admitting: *Deleted

## 2022-09-21 ENCOUNTER — Encounter: Payer: Self-pay | Admitting: Family Medicine

## 2022-09-21 ENCOUNTER — Ambulatory Visit (INDEPENDENT_AMBULATORY_CARE_PROVIDER_SITE_OTHER): Payer: Medicaid Other | Admitting: Family Medicine

## 2022-09-21 VITALS — BP 105/69 | HR 65 | Ht 64.0 in | Wt 143.2 lb

## 2022-09-21 DIAGNOSIS — Z0000 Encounter for general adult medical examination without abnormal findings: Secondary | ICD-10-CM | POA: Diagnosis not present

## 2022-09-21 DIAGNOSIS — N644 Mastodynia: Secondary | ICD-10-CM | POA: Diagnosis not present

## 2022-09-21 DIAGNOSIS — Z23 Encounter for immunization: Secondary | ICD-10-CM

## 2022-09-21 LAB — POCT URINE PREGNANCY: Preg Test, Ur: NEGATIVE

## 2022-09-21 NOTE — Progress Notes (Signed)
    SUBJECTIVE:   CHIEF COMPLAINT / HPI:   Patient presents for a physical   Concerned about breast/nipple tenderness for the past 2 weeks. Has not had this before Denies discharge. No skin changes. No fevers/chills  Tobacco: no Alcohol: no Rec drug use: no  Exercise: hiking and weight lifting 3-4 times a week Eats well   Sexually active No new partners Not concered about STI Not on contraception Has not had a pap smear Periods irregular LMP about 2 weeks ago.   Would like flu vaccine    PERTINENT  PMH / PSH: Reviewed   OBJECTIVE:   BP 105/69   Pulse 65   Ht 5\' 4"  (1.626 m)   Wt 143 lb 3.2 oz (65 kg)   SpO2 100%   BMI 24.58 kg/m    Physical exam General: well appearing, NAD Cardiovascular: RRR, no murmurs Lungs: CTAB. Normal WOB Abdomen: soft, non-distended, non-tender Skin: warm, dry. No edema Breasts: breasts appear normal, no suspicious masses, no skin or nipple changes or axillary nodes, symmetric fibrous changes in both upper outer quadrants.   ASSESSMENT/PLAN:   Breast tenderness Patient endorses tenderness around her nipples bilaterally without inciting trauma. Normal breast exam without skin changes, nipple discharge, erythema, or suspicious masses. Fibrous changes in upper outer quadrant of right breast. Suspect maybe hormonal in nature related to cycles though would expect for this to have happened before. Low likelihood for cancer given no palpable masses or for infection given no visible changes and no systemic systems. Urine pregnancy negative so not pregnancy related. Discussed conservative management with OTC pain medication. Will follow up when she returns in 2 weeks for her pap smear.    Annual exam Healthy 21 yo presents for physical.  Achieves over the recommended 150 minutes of exercise weekly. Eats well.  Vitals stable and blood pressure at goal.  Sexually active with the same partner. No concern for STIs.  Flu vaccine given Due for pap  smear but did not perform today due to time constraint. Will return in about 2 weeks for pap.    [place]

## 2022-09-21 NOTE — Patient Instructions (Addendum)
It was great seeing you today!  You came in for your physical and I am glad to see you are doing well!  We also checked a urine pregnancy test which was negative and did a breast exam. You can use Tylenol/ibuprofen to help with the breast tenderness.    I would like to see you back in about 2 weeks for your pap smear and to check on your breast pain, but if you need to be seen earlier than that for any new issues we're happy to fit you in, just give [place] a call!  Feel free to call with any questions or concerns at any time, at [phone].   Take care,  Dr. Shary Key [facility]

## 2022-09-22 DIAGNOSIS — N644 Mastodynia: Secondary | ICD-10-CM | POA: Insufficient documentation

## 2022-09-22 NOTE — Assessment & Plan Note (Signed)
Patient endorses tenderness around her nipples bilaterally without inciting trauma. Normal breast exam without skin changes, nipple discharge, erythema, or suspicious masses. Fibrous changes in upper outer quadrant of right breast. Suspect maybe hormonal in nature related to cycles though would expect for this to have happened before. Low likelihood for cancer given no palpable masses or for infection given no visible changes and no systemic systems. Urine pregnancy negative so not pregnancy related. Discussed conservative management with OTC pain medication. Will follow up when she returns in 2 weeks for her pap smear.

## 2022-09-24 NOTE — Progress Notes (Deleted)
    SUBJECTIVE:   CHIEF COMPLAINT / HPI:   Initial PAP F/u breast tenderness  PERTINENT  PMH / PSH: ***  OBJECTIVE:   There were no vitals taken for this visit. ***  General: NAD, pleasant, able to participate in exam Cardiac: RRR, no murmurs. Respiratory: CTAB, normal effort, No wheezes, rales or rhonchi Abdomen: Bowel sounds present, nontender, nondistended Extremities: no edema or cyanosis. Skin: warm and dry, no rashes noted Neuro: alert, no obvious focal deficits Psych: Normal affect and mood  ASSESSMENT/PLAN:   No problem-specific Assessment & Plan notes found for this encounter.     Dr. Colletta [place], [place]    {    This will disappear when note is signed, click to select method of visit    :1}

## 2022-10-07 ENCOUNTER — Ambulatory Visit: Payer: Medicaid Other | Admitting: Family Medicine

## 2022-10-17 ENCOUNTER — Encounter (HOSPITAL_COMMUNITY): Payer: Self-pay

## 2022-10-17 ENCOUNTER — Ambulatory Visit (HOSPITAL_COMMUNITY)
Admission: EM | Admit: 2022-10-17 | Discharge: 2022-10-17 | Disposition: A | Discharge status: Home / Self Care | Payer: Medicaid Other | Attending: Internal Medicine | Admitting: Internal Medicine

## 2022-10-17 DIAGNOSIS — L239 Allergic contact dermatitis, unspecified cause: Secondary | ICD-10-CM

## 2022-10-17 MED ORDER — DEXAMETHASONE SODIUM PHOSPHATE 10 MG/ML IJ SOLN
INTRAMUSCULAR | Status: AC
  Filled 2022-10-17: qty 1

## 2022-10-17 MED ORDER — DEXAMETHASONE SODIUM PHOSPHATE 10 MG/ML IJ SOLN
10.0000 mg | Freq: Once | INTRAMUSCULAR | Status: AC
Start: 2022-10-17 — End: 2022-10-17
  Administered 2022-10-17: 10 mg via INTRAMUSCULAR

## 2022-10-17 NOTE — ED Triage Notes (Signed)
Chief Complaint: Patient having itchiness and rash in the shoulders on day one and then it spread. The rash is getting redder.   Onset: 3 days ago  OTC medications tried: Yes- Benadryl     with no relief  Sick exposure: No  New foods or medications: Yes- Ate with her boyfriend's thanksgiving, had raspberry sauce, used a new shampoo as well.   Recent Travel: No

## 2022-10-17 NOTE — Discharge Instructions (Addendum)
You were seen today for rash.  You have been diagnosed with allergic dermatitis/allergic reaction.  We gave you a steroid shot which should help resolve your rash.  You may continue take Benadryl 50 mg every 8 hours as needed.  Please follow-up with your PCP if symptoms persist or worsen.

## 2022-10-17 NOTE — ED Provider Notes (Signed)
[facility]    CSN: [id number] Arrival date & time: 10/17/22  1001      History   Chief Complaint Chief Complaint  Patient presents with   Rash    HPI Jean Thompson is a 21 y.o. female presents to UC today with complaint of a rash on her neck, chest, back, arms and legs.  She reports this started 3 days ago.  The rash does itch.  She denies any swelling of the eyes, lips, tongue or throat.  She denies difficulty breathing.  She reports she recently changed shampoos and is not sure if this is a contributing factor.  She reports she also went to her boyfriend's house for Thanksgiving ate a raspberry sauce which is something she had never had before.  She has never had an allergic reaction before.  She has tried Benadryl OTC with minimal relief of symptoms.  HPI  Past Medical History:  Diagnosis Date   Acne    Vision abnormalities     Patient Active Problem List   Diagnosis Date Noted   Breast tenderness 09/22/2022   Contraception management 07/16/2020   Chronic midline thoracic back pain 05/26/2018   Adjustment disorder with anxious mood 08/17/2017   Elevated liver enzymes 05/10/2017   Bunion of left foot 03/04/2016   Other seasonal allergic rhinitis 02/25/2016    History reviewed. No pertinent surgical history.  OB History   No obstetric history on file.      Home Medications    Prior to Admission medications   Medication Sig Start Date End Date Taking? Authorizing Provider  amoxicillin-clavulanate (AUGMENTIN) 875-125 MG tablet Take 1 tablet by mouth every 12 (twelve) hours. 09/26/21   Rhys Martini, PA-C  cetirizine (ZYRTEC) 10 MG tablet Take 1 tablet by mouth once daily 02/11/19   Ettefagh, Aron Baba, MD  Ergocalciferol (VITAMIN D2) 400 units TABS Take 200 mg by mouth.    [provider]  fluticasone (FLONASE) 50 MCG/ACT nasal spray Place 1 spray into both nostrils daily. 09/24/21   Garlon Hatchet, PA-C  guaiFENesin (MUCINEX) 600  MG 12 hr tablet Take 1 tablet (600 mg total) by mouth 2 (two) times daily. 09/24/21   Garlon Hatchet, PA-C  methylPREDNISolone (MEDROL DOSEPAK) 4 MG TBPK tablet Take as directed Patient not taking: No sig reported 05/31/20   Wieters, Junius Creamer, PA-C    Family History Family History  Problem Relation Age of Onset   Anxiety disorder Father        Was on medicine but discontinued because did not want to be dependent on medicine   Depression Father    Tuberculosis Maternal Grandmother    Diabetes Paternal Grandmother    Diabetes Paternal Grandfather     Social History Social History   Tobacco Use   Smoking status: Never   Smokeless tobacco: Never  Substance Use Topics   Alcohol use: Never   Drug use: Never     Allergies   Patient has no known allergies.   Review of Systems Review of Systems  HENT:  Negative for facial swelling.   Respiratory:  Negative for chest tightness, shortness of breath, wheezing and stridor.   Cardiovascular:  Negative for chest pain.  Skin:  Positive for rash.     Physical Exam Triage Vital Signs ED Triage Vitals  Enc Vitals Group     BP 10/17/22 1018 108/70     Pulse Rate 10/17/22 1018 65     Resp 10/17/22 1018 16  Temp 10/17/22 1018 98.2 F (36.8 C)     Temp Source 10/17/22 1018 Oral     SpO2 10/17/22 1018 96 %     Weight --      Height --      Head Circumference --      Peak Flow --      Pain Score 10/17/22 1017 0     Pain Loc --      Pain Edu? --      Excl. in [place]? --    No data found.  Updated Vital Signs BP 108/70 (BP Location: Right Arm)   Pulse 65   Temp 98.2 F (36.8 C) (Oral)   Resp 16   LMP 09/26/2022 (Approximate)   SpO2 96%   Visual Acuity Right Eye Distance:   Left Eye Distance:   Bilateral Distance:    Right Eye Near:   Left Eye Near:    Bilateral Near:     Physical Exam Constitutional:      Appearance: Normal appearance.  HENT:     Head: Normocephalic.  Cardiovascular:     Rate and Rhythm:  Normal rate and regular rhythm.  Pulmonary:     Effort: Pulmonary effort is normal.     Breath sounds: Normal breath sounds.  Skin:    Findings: Rash present.     Comments: Convalescent maculopapular rash noted of the neck, chest, back, upper and lower extremities.  Neurological:     Mental Status: She is alert and oriented to person, place, and time.      UC Treatments / Results    Medications Ordered in UC Medications  dexamethasone (DECADRON) injection 10 mg (has no administration in time range)    Initial Impression / Assessment and Plan / UC Course  I have reviewed the triage vital signs and the nursing notes.  Pertinent labs & imaging results that were available during my care of the patient were reviewed by me and considered in my medical decision making (see chart for details).    21 year old female with 3-day history of rash of the neck, chest, back, arms and legs.  Rash is consistent with an allergic dermatitis.  Decadron 10 mg IM x1.  She will continue Benadryl 50 mg every 8 hours as needed.  Discussed ER precautions of facial swelling, swelling of the lips, tongue, throat, difficulty breathing or shortness of breath.  She will follow-up as needed if symptoms persist or worsen.  Final Clinical Impressions(s) / UC Diagnoses   Final diagnoses:  Allergic dermatitis     Discharge Instructions      You were seen today for rash.  You have been diagnosed with allergic dermatitis/allergic reaction.  We gave you a steroid shot which should help resolve your rash.  You may continue take Benadryl 50 mg every 8 hours as needed.  Please follow-up with your PCP if symptoms persist or worsen.     ED Prescriptions   None    PDMP not reviewed this encounter.   Jearld Fenton, NP 10/17/22 1030

## 2022-10-21 ENCOUNTER — Encounter: Payer: Self-pay | Admitting: Family Medicine

## 2022-10-21 ENCOUNTER — Other Ambulatory Visit: Payer: Self-pay

## 2022-10-21 ENCOUNTER — Other Ambulatory Visit (HOSPITAL_COMMUNITY)
Admission: RE | Admit: 2022-10-21 | Discharge: 2022-10-21 | Disposition: A | Discharge status: Home / Self Care | Payer: Medicaid Other | Source: Ambulatory Visit | Attending: Family Medicine | Admitting: Family Medicine

## 2022-10-21 ENCOUNTER — Ambulatory Visit (INDEPENDENT_AMBULATORY_CARE_PROVIDER_SITE_OTHER): Payer: Medicaid Other | Admitting: Family Medicine

## 2022-10-21 VITALS — BP 122/67 | HR 67 | Wt 144.2 lb

## 2022-10-21 DIAGNOSIS — N644 Mastodynia: Secondary | ICD-10-CM

## 2022-10-21 DIAGNOSIS — Z124 Encounter for screening for malignant neoplasm of cervix: Secondary | ICD-10-CM | POA: Diagnosis not present

## 2022-10-21 NOTE — Patient Instructions (Signed)
It was wonderful to see you today.  Please bring ALL of your medications with you to every visit.   Today we talked about:  We completed your pap today in addition to testing for STIs. I will follow up with you about those results in the coming days. Continue to monitor your breast for any concerning lumps and tenderness that occurs separate from your menstrual cycle. Please let come back and see me if you have any concerns.  Please follow up in 1 year for annual physical  Thank you for choosing [facility].   Please call [phone] with any questions about today's appointment.  Please be sure to schedule follow up at the front desk before you leave today.   Elberta Fortis, DO Family Medicine

## 2022-10-21 NOTE — Progress Notes (Signed)
    SUBJECTIVE:   CHIEF COMPLAINT / HPI:   Breast tenderness: Follow up from physical. Tenderness has improved, mostly around the nipple. Appears to be related to her cycle although currently her cycle is irregular. Denies lumps and nipple discharge.   PERTINENT  PMH / PSH: Irregular menstrual cycles  OBJECTIVE:   BP 122/67   Pulse 67   Wt 144 lb 3.2 oz (65.4 kg)   LMP 09/26/2022 (Approximate)   SpO2 100%   BMI 24.75 kg/m    General: NAD, pleasant, able to participate in exam Respiratory: Normal effort, no obvious respiratory distress Pelvic: VULVA: normal appearing vulva with no masses, tenderness or lesions, VAGINA: Normal appearing vagina with normal color, no lesions, with white discharge present, CERVIX: No lesions or discharge present,  Chaperone Desiree present for pelvic exam    ASSESSMENT/PLAN:   Encounter for screening for cervical cancer Pap with cytology only today. STI testing with G/C and trich.   Breast tenderness Improved and appears to be related to menstrual cycles. Advised patient to continue to monitor for breast changes.     Dr. Elberta Fortis, DO [facility]

## 2022-10-21 NOTE — Assessment & Plan Note (Signed)
Improved and appears to be related to menstrual cycles. Advised patient to continue to monitor for breast changes.

## 2022-10-21 NOTE — Assessment & Plan Note (Addendum)
Pap with cytology only today. STI testing with G/C and trich.

## 2022-10-27 LAB — CYTOLOGY - PAP
Chlamydia: NEGATIVE
Comment: NEGATIVE
Comment: NEGATIVE
Comment: NORMAL
Diagnosis: NEGATIVE
Neisseria Gonorrhea: NEGATIVE
Trichomonas: NEGATIVE

## 2023-05-10 ENCOUNTER — Ambulatory Visit (INDEPENDENT_AMBULATORY_CARE_PROVIDER_SITE_OTHER): Payer: Medicaid Other | Admitting: Student

## 2023-05-10 VITALS — BP 122/65 | HR 73 | Ht 64.0 in | Wt 151.0 lb

## 2023-05-10 DIAGNOSIS — H6503 Acute serous otitis media, bilateral: Secondary | ICD-10-CM | POA: Diagnosis not present

## 2023-05-10 MED ORDER — AMOXICILLIN 875 MG PO TABS: 875.0000 mg | ORAL_TABLET | Freq: Two times a day (BID) | ORAL | 0 refills | Status: AC

## 2023-05-10 NOTE — Patient Instructions (Signed)
Take you antibiotics twice daily for five days. We're here if you need [place], come back if it does not get better. '

## 2023-05-10 NOTE — Progress Notes (Signed)
    SUBJECTIVE:   CHIEF COMPLAINT / HPI:   Suspected Ear Infection Hx of recurrent ear infections as a child. Most recent was in 2022 and was treated with augmentin. Has had bilateral ear pain and pressure with increased sensitivity to sound for the past 2-3 weeks. Tried to wait it out but it never got better. No fevers. No other URI symptoms such as congestion or rhinorrhea.    OBJECTIVE:   BP 122/65   Pulse 73   Ht 5\' 4"  (1.626 m)   Wt 151 lb (68.5 kg)   LMP 04/13/2023   SpO2 100%   BMI 25.92 kg/m   Physical Exam Vitals reviewed.  Constitutional:      General: She is not in acute distress. HENT:     Ears:     Comments: Bilateral TMs with fluid, no bulging, purulence, or erythema. There is debris to the inferior aspect of the bilateral membranes. Canals unremarkable.     Nose: No congestion or rhinorrhea.  Eyes:     Conjunctiva/sclera: Conjunctivae normal.  Cardiovascular:     Rate and Rhythm: Normal rate and regular rhythm.     Heart sounds: No murmur heard. Pulmonary:     Effort: Pulmonary effort is normal. No respiratory distress.  Skin:    General: Skin is warm and dry.      ASSESSMENT/PLAN:   Non-recurrent acute serous otitis media of both ears Offered watchful waiting versus going ahead with antibiotic therapy. Given duration of symptoms she opts to go ahead and treat which is reasonale.  - Amoxicillin 875mg  BID x5 days. - She will return if no improvement      J Dorothyann Gibbs, MD [facility]

## 2023-05-10 NOTE — Assessment & Plan Note (Signed)
Offered watchful waiting versus going ahead with antibiotic therapy. Given duration of symptoms she opts to go ahead and treat which is reasonale.  - Amoxicillin 875mg  BID x5 days. - She will return if no improvement

## 2023-05-17 ENCOUNTER — Other Ambulatory Visit (HOSPITAL_COMMUNITY)
Admission: RE | Admit: 2023-05-17 | Discharge: 2023-05-17 | Disposition: A | Discharge status: Home / Self Care | Payer: Medicaid Other | Source: Ambulatory Visit | Attending: Family Medicine | Admitting: Family Medicine

## 2023-05-17 ENCOUNTER — Encounter: Payer: Self-pay | Admitting: Student

## 2023-05-17 ENCOUNTER — Ambulatory Visit (INDEPENDENT_AMBULATORY_CARE_PROVIDER_SITE_OTHER): Payer: Medicaid Other | Admitting: Student

## 2023-05-17 VITALS — BP 109/57 | HR 83 | Ht 64.0 in | Wt 149.2 lb

## 2023-05-17 DIAGNOSIS — B3731 Acute candidiasis of vulva and vagina: Secondary | ICD-10-CM

## 2023-05-17 DIAGNOSIS — N898 Other specified noninflammatory disorders of vagina: Secondary | ICD-10-CM | POA: Insufficient documentation

## 2023-05-17 LAB — POCT URINE PREGNANCY: Preg Test, Ur: NEGATIVE

## 2023-05-17 LAB — POCT WET PREP (WET MOUNT)
Clue Cells Wet Prep Whiff POC: NEGATIVE
Trichomonas Wet Prep HPF POC: ABSENT

## 2023-05-17 MED ORDER — FLUCONAZOLE 150 MG PO TABS: 150.0000 mg | ORAL_TABLET | ORAL | 0 refills | Status: DC

## 2023-05-17 NOTE — Patient Instructions (Signed)
It was great to see you! Thank you for allowing me to participate in your care!   Our plans for today:  - I will let you know what your wet prep and other results show  Take care and seek immediate care sooner if you develop any concerns.  Levin Erp, MD

## 2023-05-17 NOTE — Progress Notes (Signed)
    SUBJECTIVE:   CHIEF COMPLAINT / HPI:   Vaginal Discharge: Patient is a 22 y.o. female presenting with vaginal itching for 7 days.  She states the discharge is of white consistency. She is interested in screening for sexually transmitted infections today. She has irregular periods-last menstrual period 5/22. Only 1 partner who is the same as previously. Was recently treated for otitis media last week with amoxicillin.  PERTINENT  PMH / PSH: None relevant  OBJECTIVE:   BP (!) 109/57   Pulse 83   Ht 5\' 4"  (1.626 m)   Wt 149 lb 3.2 oz (67.7 kg)   LMP 04/13/2023   SpO2 96%   BMI 25.61 kg/m    General: NAD, pleasant, able to participate in exam Respiratory: Normal effort, no obvious respiratory distress Pelvic: VULVA: normal appearing vulva with no masses, tenderness or lesions, VAGINA: Normal appearing vagina with normal color, no lesions, with white discharge present, CERVIX: No lesions, scant discharge present  Chaperone Gilberto Better CMA present for pelvic exam  ASSESSMENT/PLAN:   Assessment:  22 y.o. female with vaginal discharge for 7 days, as well as itching.  Physical exam significant for whitish discharge.  Wet prep performed today shows yeast and KOH consistent with yeast vaginitis. Also showed many bacteria-awaiting G/C, HIV, Syphilis. UPT negative.   Plan: -Wet prep as above.  Will treat with diflucan-discussed with patient on phone after confirming DOB. -Diflucan 150 x 1 (additional after 72 hours if needed) -G/C, HIV, Syphilis -Follow-up as needed  Levin Erp, MD [facility]

## 2023-05-18 ENCOUNTER — Encounter: Payer: Self-pay | Admitting: Student

## 2023-05-18 LAB — CERVICOVAGINAL ANCILLARY ONLY
Chlamydia: NEGATIVE
Comment: NEGATIVE
Comment: NORMAL
Neisseria Gonorrhea: NEGATIVE

## 2023-05-18 LAB — RPR: RPR Ser Ql: NONREACTIVE

## 2023-05-18 LAB — HIV ANTIBODY (ROUTINE TESTING W REFLEX): HIV Screen 4th Generation wRfx: NONREACTIVE

## 2023-05-19 ENCOUNTER — Telehealth: Payer: Self-pay

## 2023-05-19 NOTE — Telephone Encounter (Signed)
Patient calls nurse line requesting results.   Patient advised of negative STI testing.

## 2023-05-22 ENCOUNTER — Ambulatory Visit (HOSPITAL_COMMUNITY)
Admission: EM | Admit: 2023-05-22 | Discharge: 2023-05-22 | Disposition: A | Discharge status: Home / Self Care | Payer: Medicaid Other | Attending: Emergency Medicine | Admitting: Emergency Medicine

## 2023-05-22 ENCOUNTER — Encounter (HOSPITAL_COMMUNITY): Payer: Self-pay

## 2023-05-22 DIAGNOSIS — H6991 Unspecified Eustachian tube disorder, right ear: Secondary | ICD-10-CM

## 2023-05-22 DIAGNOSIS — H9201 Otalgia, right ear: Secondary | ICD-10-CM | POA: Diagnosis not present

## 2023-05-22 MED ORDER — FLUTICASONE PROPIONATE 50 MCG/ACT NA SUSP: 2.0000 | Freq: Every day | NASAL | 0 refills | Status: AC

## 2023-05-22 MED ORDER — PREDNISONE 20 MG PO TABS: 40.0000 mg | ORAL_TABLET | Freq: Every day | ORAL | 0 refills | Status: AC

## 2023-05-22 NOTE — ED Provider Notes (Signed)
HPI  SUBJECTIVE:  Jean Thompson is a 22 y.o. female who presents with 2 to 3 days of intermittent, sharp, hours long right ear pain [place]  She reports popping and a sensation of fluid in her ear.  She reports intolerance to loud noise.  No fevers, otorrhea, nasal congestion, allergy or URI symptoms.  She does not grind her teeth.  No recent swimming.  She uses Q-tips.  She denies other foreign body insertion.  No headaches, facial rash.  She took Midol with improvement in her symptoms, last dose was within 6 hours of evaluation.  Pain is worse with exposure to loud noise.  She has a past medical history of bilateral otitis media that was treated with 5 days of amoxicillin on 6/18.  LMP: Now.  Denies the possibility being pregnant.  PCP: [facility].   Past Medical History:  Diagnosis Date   Acne    Vision abnormalities     History reviewed. No pertinent surgical history.  Family History  Problem Relation Age of Onset   Anxiety disorder Father        Was on medicine but discontinued because did not want to be dependent on medicine   Depression Father    Tuberculosis Maternal Grandmother    Diabetes Paternal Grandmother    Diabetes Paternal Grandfather     Social History   Tobacco Use   Smoking status: Never   Smokeless tobacco: Never  Substance Use Topics   Alcohol use: Never   Drug use: Never    No current facility-administered medications for this encounter.  Current Outpatient Medications:    fluticasone (FLONASE) 50 MCG/ACT nasal spray, Place 2 sprays into both nostrils daily., Disp: 16 g, Rfl: 0   predniSONE (DELTASONE) 20 MG tablet, Take 2 tablets (40 mg total) by mouth daily with breakfast for 5 days., Disp: 10 tablet, Rfl: 0   cetirizine (ZYRTEC) 10 MG tablet, Take 1 tablet by mouth once daily, Disp: 30 tablet, Rfl: 0   Ergocalciferol (VITAMIN D2) 400 units TABS, Take 200 mg by mouth., Disp: , Rfl:     fluconazole (DIFLUCAN) 150 MG tablet, Take 1 tablet (150 mg total) by mouth every 3 (three) days. Take 1 tablet, if not improved may take additional tablet in 3 days, Disp: 2 tablet, Rfl: 0  No Known Allergies   ROS  As noted in HPI.   Physical Exam  BP 115/79 (BP Location: Left Arm)   Pulse (!) 57   Temp 98.4 F (36.9 C) (Oral)   Resp 18   LMP 04/13/2023   SpO2 98%   Constitutional: Well developed, well nourished, no acute distress Eyes:  EOMI, conjunctiva normal bilaterally HENT: Normocephalic, atraumatic,mucus membranes moist.  Right ear: Right external ear, EAC, TM normal.  No air-fluid levels behind the TM.  TM intact.  No pain with traction on pinna, palpation of mastoid.  Mild tenderness with palpation of the tragus.  TMJ nontender, no crepitus.  Hearing louder in the right ear compared to left.  Positive nasal congestion, worse on the right.  Erythematous, swollen turbinates. Neck: No cervical lymphadenopathy Respiratory: Normal inspiratory effort Cardiovascular: Normal rate GI: nondistended skin: No rash, skin intact Musculoskeletal: no deformities Neurologic: Alert & oriented x 3, no focal neuro deficits Psychiatric: Speech and behavior appropriate   ED Course   Medications - No data to display  No orders of the defined types were placed in this encounter.   No results found  for this or any previous visit (from the past 24 hour(s)). No results found.  ED Clinical Impression  1. Dysfunction of right eustachian tube   2. Otalgia of right ear      ED Assessment/Plan     Outside records reviewed.  As noted in HPI.  Suspect eustachian tube dysfunction.  There is no evidence of serous or purulent otitis media.  She does have some tenderness over the tragus, but no tenderness over the TMJ, no crepitus.  Home with Flonase, 40 mg prednisone for 5 days.  Follow-up with PCP or ear nose throat if no better after finishing medication.  Discussed MDM, treatment  plan, and plan for follow-up with patient. patient agrees with plan.   Meds ordered this encounter  Medications   fluticasone (FLONASE) 50 MCG/ACT nasal spray    Sig: Place 2 sprays into both nostrils daily.    Dispense:  16 g    Refill:  0   predniSONE (DELTASONE) 20 MG tablet    Sig: Take 2 tablets (40 mg total) by mouth daily with breakfast for 5 days.    Dispense:  10 tablet    Refill:  0      *This clinic note was created using Scientist, clinical (histocompatibility and immunogenetics). Therefore, there may be occasional mistakes despite careful proofreading.  ?    Domenick Gong, MD 05/22/23 [phone]

## 2023-05-22 NOTE — ED Triage Notes (Signed)
Pt presents with right ear pain x 1 week. Pt stated she was treated with Amox for her ear infection 2 weeks ago.

## 2023-05-22 NOTE — Discharge Instructions (Signed)
I suspect eustachian tube dysfunction.  Continue your Zyrtec.  Start Flonase, finish prednisone.

## 2023-06-21 ENCOUNTER — Ambulatory Visit (INDEPENDENT_AMBULATORY_CARE_PROVIDER_SITE_OTHER): Payer: Medicaid Other | Admitting: Student

## 2023-06-21 VITALS — BP 115/68 | HR 70 | Resp 16 | Ht 64.0 in | Wt 149.4 lb

## 2023-06-21 DIAGNOSIS — H9203 Otalgia, bilateral: Secondary | ICD-10-CM | POA: Diagnosis not present

## 2023-06-21 DIAGNOSIS — H9209 Otalgia, unspecified ear: Secondary | ICD-10-CM | POA: Insufficient documentation

## 2023-06-21 NOTE — Patient Instructions (Addendum)
It was great to see you today! Thank you for choosing [facility] for your primary care.  Today we addressed: We will refer you to ENT Keep using Zyrtec and Flonase   If you haven't already, sign up for My Chart to have easy access to your labs results, and communication with your primary care physician. I recommend that you always bring your medications to each appointment as this makes it easy to ensure you are on the correct medications and helps [place] not miss refills when you need them. Call the clinic at [phone] if your symptoms worsen or you have any concerns. Return if symptoms worsen or fail to improve. Please arrive 15 minutes before your appointment to ensure smooth check in process.  We appreciate your efforts in making this happen.  Thank you for allowing me to participate in your care, Jean Martinez, MD 06/21/2023, 3:12 PM PGY-3, [facility]

## 2023-06-21 NOTE — Progress Notes (Signed)
  SUBJECTIVE:   CHIEF COMPLAINT / HPI:   Ear Pain:  Right ear pain since late June, went to the ED for this.  She was given prednisone and Flonase in the emergency room and instructed to follow-up with PCP if symptoms remained. Steroid helped for a small amount of time. Flonase was not helpful at all. She reports muffled sounds, had ear infections when she was young often.   -No history of nasal itch -No frequent sneezing  -No nasal discharge/sinus pressure  -No sore throat/heartburn -No smoking -No history of head and neck radiation   PERTINENT  PMH / PSH:  Allergies Had diagnosis of otitis media in both ears on 05/10/23  Patient Care Team: Elberta Fortis, MD as PCP - General (Family Medicine) OBJECTIVE:  BP 115/68 (BP Location: Left Arm, Patient Position: Sitting, Cuff Size: Normal)   Pulse 70   Resp 16   Ht 5\' 4"  (1.626 m)   Wt 149 lb 6.4 oz (67.8 kg)   SpO2 100%   BMI 25.64 kg/m  Physical Exam  General: NAD, pleasant, able to participate in exam HEENT:     Head: Normocephalic, atraumatic    Neck: No masses palpated. No goiter. No lymphadenopathy     Ears: External ears normal, no drainage.Tympanic membranes intact, normal light reflex bilaterally, no erythema or bulging    Nose: nasal turbinates moist, no nasal discharge    Throat: moist mucus membranes, no pharyngeal erythema, no tonsillar exudate. Airway is patent Respiratory: No respiratory distress Skin: warm and dry, no rashes noted Psych: Normal affect and mood  ASSESSMENT/PLAN:  Otalgia of both ears Assessment & Plan: Ddx broad: Eustachian tube dysfunction most likely although no environmental irritants, ETDQ 31. Other ddx vestibular (no evidence of vestibular dysfunction), TMJ although presentation makes this unlikely, Meniere's is also possible. ENT referral placed for further assistance. Can continue with flonase and Zyrtec for allergies.   Orders: -     Ambulatory referral to ENT   Aware of PHQ9  findings and discussed with the patient. She denies SI/HI and would not like to discuss depressed symptoms at this time.   Return if symptoms worsen or fail to improve. Alfredo Martinez, MD 06/21/2023, 3:18 PM PGY-3, [facility]

## 2023-06-21 NOTE — Assessment & Plan Note (Signed)
Ddx broad: Eustachian tube dysfunction most likely although no environmental irritants, ETDQ 31. Other ddx vestibular (no evidence of vestibular dysfunction), TMJ although presentation makes this unlikely, Meniere's is also possible. ENT referral placed for further assistance. Can continue with flonase and Zyrtec for allergies.

## 2023-08-18 ENCOUNTER — Institutional Professional Consult (permissible substitution) (INDEPENDENT_AMBULATORY_CARE_PROVIDER_SITE_OTHER): Payer: Medicaid Other | Admitting: Otolaryngology

## 2023-09-09 IMAGING — DX DG ELBOW COMPLETE 3+V*R*
4 series · 4 of 4 positions shown · non-contrast
Comparison: None.

CLINICAL DATA: Pain.

EXAM:
RIGHT ELBOW - COMPLETE 3+ VIEW

[elbow lat]
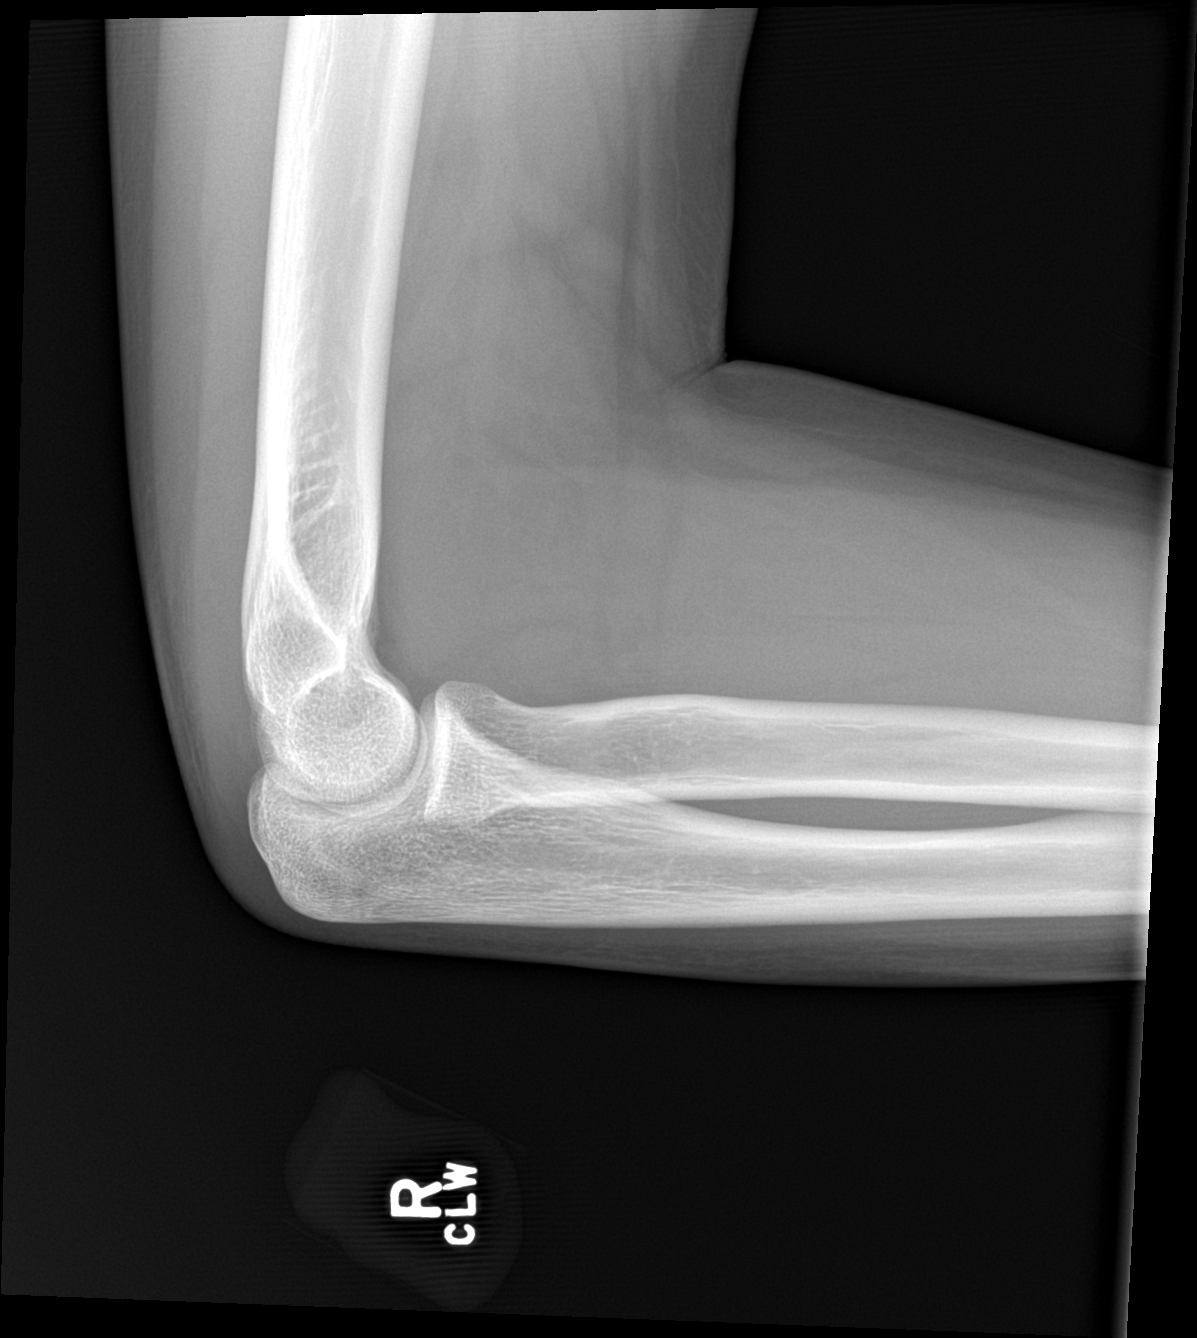

[elbow obl (1 of 2)]
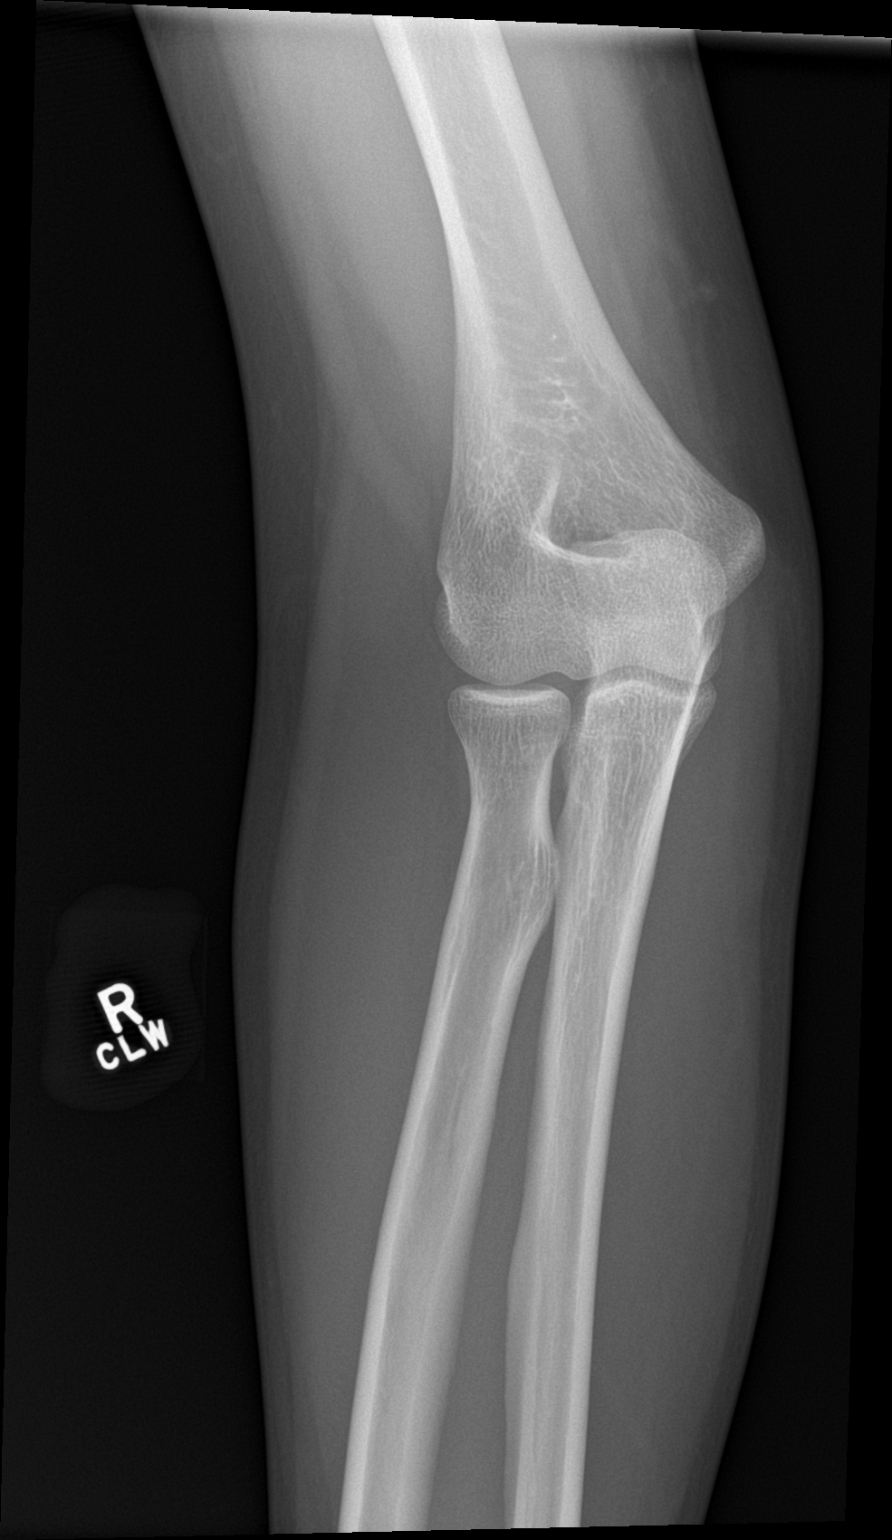

[elbow obl (2 of 2)]
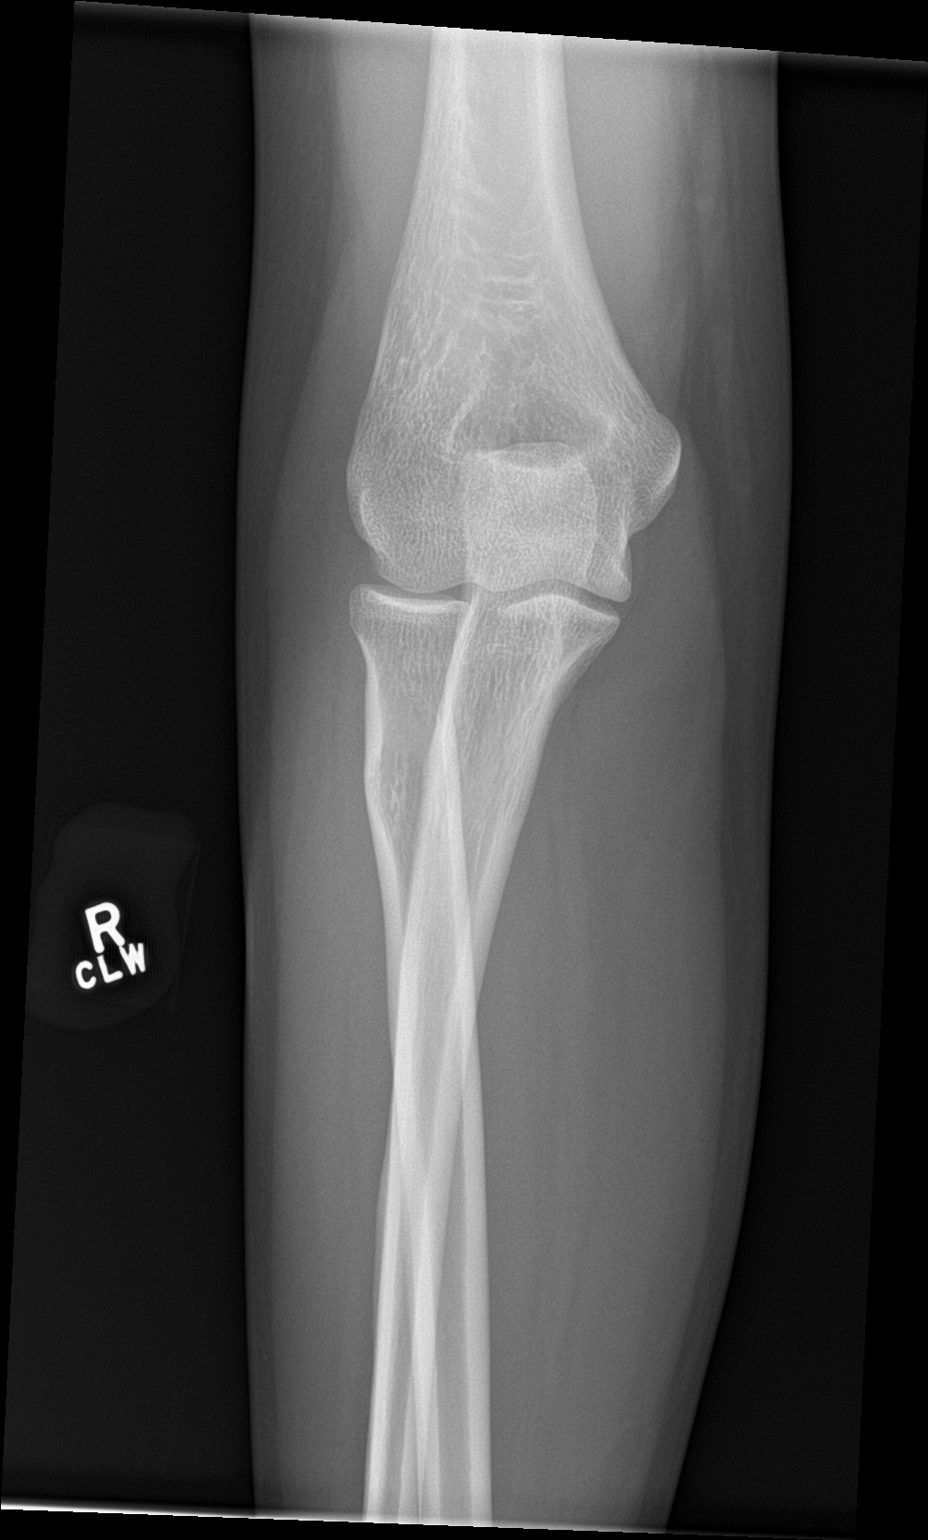

[elbow ap]
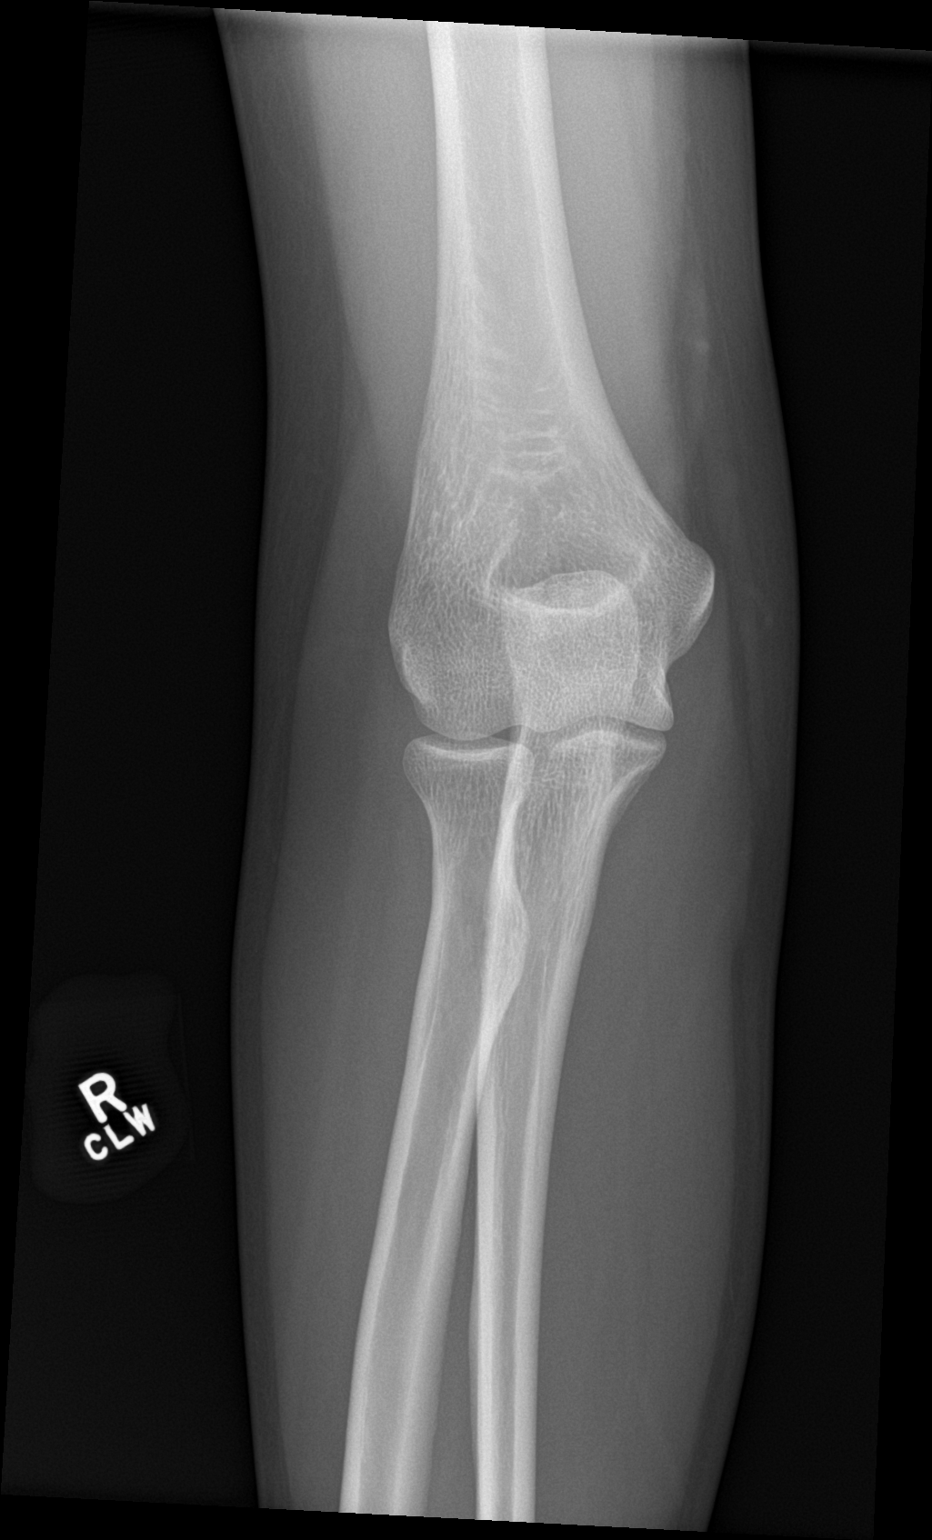

[4 of 4 positions shown; findings below may reference images not displayed]

FINDINGS: There is no evidence of fracture, dislocation, or joint effusion.
There is no evidence of arthropathy or other focal bone abnormality.
Soft tissues are unremarkable.
IMPRESSION: Negative.

## 2023-09-27 ENCOUNTER — Telehealth: Payer: Self-pay

## 2023-09-27 ENCOUNTER — Ambulatory Visit: Payer: Medicaid Other

## 2023-09-27 ENCOUNTER — Other Ambulatory Visit: Payer: Self-pay

## 2023-09-27 VITALS — BP 136/86 | HR 86 | Ht 64.0 in | Wt 147.6 lb

## 2023-09-27 DIAGNOSIS — F32A Depression, unspecified: Secondary | ICD-10-CM | POA: Diagnosis not present

## 2023-09-27 DIAGNOSIS — F419 Anxiety disorder, unspecified: Secondary | ICD-10-CM | POA: Diagnosis present

## 2023-09-27 MED ORDER — ESCITALOPRAM OXALATE 10 MG PO TABS: 10.0000 mg | ORAL_TABLET | Freq: Every day | ORAL | 0 refills | Status: DC

## 2023-09-27 NOTE — Progress Notes (Unsigned)
    SUBJECTIVE:   CHIEF COMPLAINT / HPI:   Anxiety Jean Thompson has a history of anxiety, she previously followed with adolescent medicine at the [facility].  She was on Prozac previously but has not been on any medication for several years.  She comes in today due to an acute worsening of her symptoms in the setting of the breakdown of her long-term relationship to her fianc.  This happened just last Friday and unfortunately she feels like her anxiety both contributed to the break-up and of course is exacerbated by the break-up. She has signed up for a mental health study through [facility] where she will be receiving some counseling services.  Their program actually requested that she come to see [place] today due to her response on a PHQ-9 indicating that she does have thoughts of death that she might be better off dead.  On further questioning today, she tells me that these are fleeting thoughts that they "cross my mind" but then go away.  She identifies a sense of obligation to caring for her little sister as a protective factor here.  Does not believe that she would ever actually follow through on hurting herself.  Denies access to firearms.  Denies having any specific plan in mind.  Though she does have these occasional depressive thoughts, she continually returns to her anxiety as the driving symptom in her case.   OBJECTIVE:   BP 136/86   Pulse 86   Ht 5\' 4"  (1.626 m)   Wt 147 lb 9.6 oz (67 kg)   SpO2 100%   BMI 25.34 kg/m   Gen: Well-appearing, anxious HEENT: normocephalic, atraumatic, EOM grossly intact, oral mucosa moist, neck supple Respiratory: normal respiratory effort GI: non-distended Skin: no rashes, no jaundice Psych: Anxious. No SI/HI. Smiles, mood is not depressed.   ASSESSMENT/PLAN:   Anxiety and depression Fairly significant symptoms.  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  PHQ-9 score of 13 with an answer of "2" on question 9.  However, she is able to contract for  safety and denies any suicidal plan or intent.  Long discussion today about treatment options including therapy and medications.  She is initially hesitant to start medications due to having some headaches with her Prozac when she was on them as an adolescent, however, she is willing to try a different agent. -Getting therapy through the [facility] program -She also plans to reach out to a community therapist through [email or web address] -We will start Lexapro 10 mg daily -Follow-up via MyChart video visit in 2 weeks -We discussed urgent/emergent care resources in town including [address] and [address] Walk-in services     J Dorothyann Gibbs, MD [facility]

## 2023-09-27 NOTE — Patient Instructions (Addendum)
The best place I know of to get plugged in with a therapist is:  [email or web address] where you can search by zip code and insurance. THE HARDEST PART OF ALL OF THIS IS MAKING THE FIRST CALL.  I'd like to start you on a medication called Lexapro. We'll start at 10mg  per day and then I'd like to see you back in the next few weeks to check in and see how you're doing.    IF you get to the point where you are overwhelmed by your anxiety/depression and want to hurt yourself, know that there are plenty of community resources: We have the [facility] at [address] [place], [place] [address]. They are open 24 hours a day. Of course, you can also try the Suicide Hotline at 71. If you just don't know what to do, you can always call 911 and they will take you to urgent care. [facility] also offers the following walk in options: Urgent psychiatry (medication management) Monday-Thursday 8-11AM.   It is highly recommended that you show up at 7/730 because it is first come first serve.  For urgent therapy (not medication) Walk in hours are 8-1pm Monday through Wednesday (please come at 7/730 to ensure you are seen)  Eliezer Mccoy, MD

## 2023-09-27 NOTE — Telephone Encounter (Signed)
Received VM from [place] at [facility] regarding patient.   Returned call to [place]. Patient is a participant in mental health program at [facility]. [place] calling to advise of passive SI. Reports that on screening, patient endorsed feeling that life is not worth living.   She has no active plans or thoughts of self harm. Patient was provided with behavioral health resources by [facility] staff.   Patient has also scheduled follow up appointment with our office this PM.   She will be seeing Dr. Marisue Humble in [facility] clinic.   Veronda Prude, RN

## 2023-09-28 DIAGNOSIS — F32A Depression, unspecified: Secondary | ICD-10-CM | POA: Insufficient documentation

## 2023-09-28 NOTE — Assessment & Plan Note (Signed)
Fairly significant symptoms.  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  PHQ-9 score of 13 with an answer of "2" on question 9.  However, she is able to contract for safety and denies any suicidal plan or intent.  Long discussion today about treatment options including therapy and medications.  She is initially hesitant to start medications due to having some headaches with her Prozac when she was on them as an adolescent, however, she is willing to try a different agent. -Getting therapy through the [facility] program -She also plans to reach out to a community therapist through [email or web address] -We will start Lexapro 10 mg daily -Follow-up via MyChart video visit in 2 weeks -We discussed urgent/emergent care resources in town including [address] and [address] [employer]

## 2023-10-10 ENCOUNTER — Telehealth: Payer: Self-pay | Admitting: Student

## 2023-10-10 NOTE — Telephone Encounter (Signed)
Telephone follow-up for anxiety/adjustment disorder. She tells me that she is really feeling quite well. She has gotten plugged in with the Strong program at [facility] which gives her weekly in-person counseling. She is also planning on plugging in with a therapist throughout [email or web address] for virtual counseling.  She is currently finding great joy in painting her bedroom which is an outlet for her. We discussed the possibility of increasing her Lexapro dose to 20 mg daily, at this time given that she is feeling well she prefers to remain on the 10 mg dose and knows to make an appointment if she feels like she needs a little more medication support. Eliezer Mccoy, MD

## 2023-10-11 ENCOUNTER — Telehealth: Payer: Medicaid Other | Admitting: Student

## 2023-10-28 ENCOUNTER — Other Ambulatory Visit: Payer: Self-pay | Admitting: Student

## 2023-10-28 DIAGNOSIS — F419 Anxiety disorder, unspecified: Secondary | ICD-10-CM

## 2023-10-28 DIAGNOSIS — F32A Depression, unspecified: Secondary | ICD-10-CM

## 2024-04-12 ENCOUNTER — Ambulatory Visit (INDEPENDENT_AMBULATORY_CARE_PROVIDER_SITE_OTHER): Admitting: Family Medicine

## 2024-04-12 VITALS — BP 115/85 | HR 104 | Temp 98.3°F | Ht 64.0 in | Wt 140.6 lb

## 2024-04-12 DIAGNOSIS — B353 Tinea pedis: Secondary | ICD-10-CM | POA: Insufficient documentation

## 2024-04-12 DIAGNOSIS — B081 Molluscum contagiosum: Secondary | ICD-10-CM | POA: Insufficient documentation

## 2024-04-12 MED ORDER — CLOTRIMAZOLE 1 % EX CREA: 1.0000 | TOPICAL_CREAM | Freq: Two times a day (BID) | CUTANEOUS | 0 refills | Status: DC

## 2024-04-12 NOTE — Progress Notes (Signed)
    SUBJECTIVE:   CHIEF COMPLAINT / HPI:   Concern for bacterial infection of toe Has been there for a while now since she returned from [place]. The toenail is thicker and yellower than normal. It is itchy. No increased swelling. Mild erythema. No significant drainage. No endorsement of trauma. Never had this before. She does go to the gym and showers.  Rash on neck Present for about half a year. Itchy. It has spread around her neck now. Never had this before. No other areas of the rash. She is not currently sexually active but she has been in the past. 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  PERTINENT  PMH / PSH: allergies, bunion of L foot, anxiety and depression  OBJECTIVE:   BP 115/85   Pulse (!) 104   Temp 98.3 F (36.8 C)   Ht 5\' 4"  (1.626 m)   Wt 140 lb 9.6 oz (63.8 kg)   LMP 04/05/2024 (Approximate)   SpO2 96%   BMI 24.13 kg/m   General: Alert and oriented, in NAD Skin: Warm, dry, and intact; mildly hypertrophic and discolored left pinky nail with peeling/irritated skin in the webspace as shown below; multiple subcentimeter umbilicated papules along the neck without significant erythema or pain HEENT: NCAT, EOM grossly normal, midline nasal septum Respiratory: Breathing and speaking comfortably on RA Extremities: Moves all extremities grossly equally Neurological: No gross focal deficit Psychiatric: Appropriate mood and affect            ASSESSMENT/PLAN:   Assessment & Plan Tinea pedis of left foot As appreciated on exam.  Question some component of nail trauma as well.  Will send in topical Lotrimin cream to be applied twice a day until resolution.  Discussed wearing shoes and moist, communal areas to help prevent recurrence. Molluscum contagiosum Most likely because of umbilicated skin colored papules in this patient with close contact with a child.  Immunosuppression also considered; however, patient is not recently sexually active and STI  testing last year was normal other than BV and yeast.  Advised this will take months to resolve.    Genetta Kenning, MD [facility]

## 2024-04-12 NOTE — Assessment & Plan Note (Signed)
 Most likely because of umbilicated skin colored papules in this patient with close contact with a child.  Immunosuppression also considered; however, patient is not recently sexually active and STI testing last year was normal other than BV and yeast.  Advised this will take months to resolve.

## 2024-04-12 NOTE — Patient Instructions (Addendum)
 You have athletes foot. There could also be some traumatic changes to the nail when you were barefoot. I have sent in a medication to apply to this toe.  For the rash, this is molluscum contagiosum. This is a viral infection that is fairly common. This should resolve on its own in a couple of months.

## 2024-04-12 NOTE — Assessment & Plan Note (Signed)
 As appreciated on exam.  Question some component of nail trauma as well.  Will send in topical Lotrimin cream to be applied twice a day until resolution.  Discussed wearing shoes and moist, communal areas to help prevent recurrence.

## 2024-05-15 ENCOUNTER — Ambulatory Visit (INDEPENDENT_AMBULATORY_CARE_PROVIDER_SITE_OTHER): Admitting: Family Medicine

## 2024-05-15 VITALS — BP 100/60 | HR 68 | Wt 138.0 lb

## 2024-05-15 DIAGNOSIS — B353 Tinea pedis: Secondary | ICD-10-CM | POA: Diagnosis not present

## 2024-05-15 DIAGNOSIS — B081 Molluscum contagiosum: Secondary | ICD-10-CM | POA: Diagnosis not present

## 2024-05-15 DIAGNOSIS — L602 Onychogryphosis: Secondary | ICD-10-CM | POA: Diagnosis not present

## 2024-05-15 MED ORDER — CLOTRIMAZOLE 1 % EX CREA: 1.0000 | TOPICAL_CREAM | Freq: Two times a day (BID) | CUTANEOUS | 1 refills | Status: AC

## 2024-05-15 NOTE — Patient Instructions (Signed)
 Good to see you today - Thank you for coming in  Things we discussed today:  1) For your neck rash, I will have you follow-up in order skin clinic every clinic.  They will go over options with you including cryotherapy (freezing the warts off) or other topical treatments.  2) For your nail, we will send a fungal culture to see if it is actually a fungal infection. This will take a few weeks to get the results, in the meantime, continue your topical lotrimin  and keep your feet dry.

## 2024-05-15 NOTE — Progress Notes (Signed)
    SUBJECTIVE:   CHIEF COMPLAINT / HPI:   BF is a 23yo F that pf tinea pedis and molluscum f/u. - Was seen 5/22 for same symptoms.  At that time rash was thought to be due to abdominal discomfort and foot symptoms were thought to be due to tinea pedis.  Patient was treated with topical clotrimazole  - Pt reports that foot rash is improving, but the toenail discoloration is unchanged - denies hx of trauma to the area - Also requests options to treat her molluscum    OBJECTIVE:   Wt 138 lb (62.6 kg)   LMP 05/09/2024   BMI 23.69 kg/m   General: Alert, pleasant woman. NAD. HEENT: NCAT. MMM. CV: RRR, no murmurs.  Resp: CTAB, no wheezing or crackles. Normal WOB on RA.   Ext: Moves all ext spontaneously Skin: Scattered fleshy round bumps w/ central penduculation on R clavicular area and L sided neck. Thickened, yellowish discoloration of L pinky toe.        ASSESSMENT/PLAN:   Assessment & Plan Tinea pedis of left foot Improving. Advised to continue topical clotrimazole  until resolved. Molluscum contagiosum Rash cw molluscum. Discussed case w/ Dr. Anders.Will schedule in Derm clinic. Discuss options including cryotherapy for molluscum versus other topical options; pt is leaning towards cryotherapy. - f/u in Derm clinic - Consider HIV screen if worsens Thickened nail Suspect onnychomycosis vs trauma-related discoloration. Will send fungal nail culture.     Twyla Nearing, MD [facility]

## 2024-05-17 NOTE — Assessment & Plan Note (Signed)
 Rash cw molluscum. Discussed case w/ Dr. Anders.Will schedule in Derm clinic. Discuss options including cryotherapy for molluscum versus other topical options; pt is leaning towards cryotherapy. - f/u in Derm clinic - Consider HIV screen if worsens

## 2024-05-17 NOTE — Assessment & Plan Note (Signed)
 Improving. Advised to continue topical clotrimazole  until resolved.

## 2024-05-24 ENCOUNTER — Ambulatory Visit: Admitting: Family Medicine

## 2024-05-24 VITALS — BP 101/62 | HR 73 | Wt 140.6 lb

## 2024-05-24 DIAGNOSIS — B081 Molluscum contagiosum: Secondary | ICD-10-CM

## 2024-05-24 NOTE — Assessment & Plan Note (Signed)
 Present upon exam, desires removal and given prolonged course of the last couple of years less likely to self resolve.  Patient opted for cryotherapy of lesions, performed successfully as below on ~40 lesions.  Aftercare instructions provided.  May require additional treatments if new lesions occur.

## 2024-05-24 NOTE — Progress Notes (Signed)
    SUBJECTIVE:   CHIEF COMPLAINT / HPI:   Skin growths on neck Have been present since 2023.  Reports initially was only 1-2 spots but now have increased in number.  Reports she did have a couple of spots on the side of her neck that did go away but have since recurred.  Reports they are not painful or itchy.  Because of location she does scratch them at times.  Denies similar lesions on her body.  Denies immunosuppression.  Denies fevers or other systemic illnesses  PERTINENT  PMH / PSH: None  OBJECTIVE:   BP 101/62   Pulse 73   Wt 140 lb 9.6 oz (63.8 kg)   LMP 05/09/2024 (Approximate)   SpO2 97%   BMI 24.13 kg/m    General: NAD, pleasant, able to participate in exam Skin: ~40 well-circumscribed, umbilicated papules on anterior right side of neck and lateral left side of neck extending to the back of the neck.  Varying in size.      ASSESSMENT/PLAN:   Assessment & Plan Molluscum contagiosum Present upon exam, desires removal and given prolonged course of the last couple of years less likely to self resolve.  Patient opted for cryotherapy of lesions, performed successfully as below on ~40 lesions.  Aftercare instructions provided.  May require additional treatments if new lesions occur.   Diagnosis: Molluscum contagiosum Procedure: Cryotherapy Location: Anterior and lateral neck  After discussion of the risks, benefits, and alternative therapies available, the patient elected to proceed. After obtaining written informed consent, the patient's identity, procedure, and site were verified during a time out prior to proceeding procedure. The lesions on the anterior and lateral neck were treated using liquid nitrogen applied via fox swab to affected lesions. The patient tolerated the procedure well and there were no immediate complications.  Patient was provided aftercare handout and advised to return if lesion(s) did not fully resolved.    Dr. Izetta Nap, DO [place]  [facility]

## 2024-05-24 NOTE — Patient Instructions (Signed)
 It was wonderful to see you today! Thank you for choosing [facility].   Please bring ALL of your medications with you to every visit.   Today we talked about:  You had therapy done today spots around your neck.  The area may blister up, please keep Vaseline over the site and you can keep it covered if needed.  You can expect to see a response in 2 to 4 weeks.  If after that time more lesions are still present, we can treat with cryotherapy again. New areas can still emerge in the coming weeks.  Please follow up as needed if symptoms continue  If you haven't already, sign up for My Chart to have easy access to your labs results, and communication with your primary care physician.  Call the clinic at [phone] if your symptoms worsen or you have any concerns.  Please be sure to schedule follow up at the front desk before you leave today.   Izetta Nap, DO Family Medicine

## 2024-06-06 LAB — FUNGUS CULTURE, BLOOD

## 2024-06-07 ENCOUNTER — Ambulatory Visit: Payer: Self-pay | Admitting: Student
